# Patient Record
Sex: Male | Born: 1960 | Race: White | Hispanic: No | Marital: Married | Smoking: Never smoker
Health system: Southern US, Community
[De-identification: ages and names within clinical notes are randomized; demographics above are authoritative.]

## PROBLEM LIST (undated history)

## (undated) DIAGNOSIS — I499 Cardiac arrhythmia, unspecified: Secondary | ICD-10-CM

## (undated) DIAGNOSIS — F32A Depression, unspecified: Secondary | ICD-10-CM

## (undated) DIAGNOSIS — F329 Major depressive disorder, single episode, unspecified: Secondary | ICD-10-CM

## (undated) DIAGNOSIS — E785 Hyperlipidemia, unspecified: Secondary | ICD-10-CM

## (undated) DIAGNOSIS — F419 Anxiety disorder, unspecified: Secondary | ICD-10-CM

## (undated) HISTORY — PX: JOINT REPLACEMENT: SHX530

---

## 2013-10-23 HISTORY — PX: HIP SURGERY: SHX245

## 2013-11-10 ENCOUNTER — Encounter (HOSPITAL_COMMUNITY): Payer: Self-pay | Admitting: Emergency Medicine

## 2013-11-10 ENCOUNTER — Emergency Department (HOSPITAL_COMMUNITY)
Admission: EM | Admit: 2013-11-10 | Discharge: 2013-11-10 | Disposition: A | Discharge status: Other Acute Inpatient Hospital | Payer: Medicare (Managed Care) | Attending: Psychiatry | Admitting: Psychiatry

## 2013-11-10 ENCOUNTER — Inpatient Hospital Stay (HOSPITAL_COMMUNITY)
Admission: AD | Admit: 2013-11-10 | Discharge: 2013-11-18 | DRG: 882 | Disposition: A | Discharge status: Home / Self Care | Payer: Medicare (Managed Care) | Source: Intra-hospital | Attending: Psychiatry | Admitting: Psychiatry

## 2013-11-10 ENCOUNTER — Encounter (HOSPITAL_COMMUNITY): Payer: Self-pay | Admitting: *Deleted

## 2013-11-10 ENCOUNTER — Emergency Department (HOSPITAL_COMMUNITY): Payer: Medicare (Managed Care)

## 2013-11-10 DIAGNOSIS — F323 Major depressive disorder, single episode, severe with psychotic features: Secondary | ICD-10-CM

## 2013-11-10 DIAGNOSIS — F22 Delusional disorders: Secondary | ICD-10-CM

## 2013-11-10 DIAGNOSIS — I10 Essential (primary) hypertension: Secondary | ICD-10-CM

## 2013-11-10 DIAGNOSIS — E785 Hyperlipidemia, unspecified: Secondary | ICD-10-CM

## 2013-11-10 DIAGNOSIS — F309 Manic episode, unspecified: Secondary | ICD-10-CM | POA: Insufficient documentation

## 2013-11-10 DIAGNOSIS — R739 Hyperglycemia, unspecified: Secondary | ICD-10-CM

## 2013-11-10 DIAGNOSIS — F4323 Adjustment disorder with mixed anxiety and depressed mood: Principal | ICD-10-CM

## 2013-11-10 DIAGNOSIS — F315 Bipolar disorder, current episode depressed, severe, with psychotic features: Secondary | ICD-10-CM

## 2013-11-10 DIAGNOSIS — Z79899 Other long term (current) drug therapy: Secondary | ICD-10-CM

## 2013-11-10 DIAGNOSIS — R4182 Altered mental status, unspecified: Secondary | ICD-10-CM | POA: Insufficient documentation

## 2013-11-10 HISTORY — DX: Major depressive disorder, single episode, unspecified: F32.9

## 2013-11-10 HISTORY — DX: Hyperlipidemia, unspecified: E78.5

## 2013-11-10 HISTORY — DX: Cardiac arrhythmia, unspecified: I49.9

## 2013-11-10 HISTORY — DX: Anxiety disorder, unspecified: F41.9

## 2013-11-10 HISTORY — DX: Depression, unspecified: F32.A

## 2013-11-10 LAB — COMPREHENSIVE METABOLIC PANEL
Albumin: 4.2 g/dL (ref 3.5–5.2)
BUN: 9 mg/dL (ref 6–23)
CO2: 26 mEq/L (ref 19–32)
Calcium: 9.8 mg/dL (ref 8.4–10.5)
Chloride: 96 mEq/L (ref 96–112)
Creatinine, Ser: 0.78 mg/dL (ref 0.50–1.35)
GFR calc Af Amer: >90 mL/min (ref >90)
GFR calc non Af Amer: >90 mL/min (ref >90)
Glucose, Bld: 141 mg/dL — ABNORMAL HIGH (ref 70–99)
Potassium: 3.4 mEq/L — ABNORMAL LOW (ref 3.5–5.1)
Total Bilirubin: 0.5 mg/dL (ref 0.3–1.2)

## 2013-11-10 LAB — CBC WITH DIFFERENTIAL/PLATELET
Eosinophils Absolute: 0.1 10*3/uL (ref 0.0–0.7)
Lymphocytes Relative: 19 % (ref 12–46)
Lymphs Abs: 1.4 10*3/uL (ref 0.7–4.0)
MCHC: 34.1 g/dL (ref 30.0–36.0)
Neutro Abs: 5.4 10*3/uL (ref 1.7–7.7)
Neutrophils Relative %: 72 % (ref 43–77)
Platelets: 389 10*3/uL (ref 150–400)
RBC: 4.76 MIL/uL (ref 4.22–5.81)
WBC: 7.6 10*3/uL (ref 4.0–10.5)

## 2013-11-10 LAB — RAPID URINE DRUG SCREEN, HOSP PERFORMED
Amphetamines: NOT DETECTED
Barbiturates: NOT DETECTED
Benzodiazepines: NOT DETECTED
Cocaine: NOT DETECTED
Opiates: NOT DETECTED

## 2013-11-10 LAB — ETHANOL: Alcohol, Ethyl (B): <11 mg/dL (ref 0–11)

## 2013-11-10 MED ORDER — TRAZODONE HCL 50 MG PO TABS: 50.0000 mg | ORAL_TABLET | Freq: Every evening | ORAL | Status: DC | PRN

## 2013-11-10 MED ORDER — MAGNESIUM HYDROXIDE 400 MG/5ML PO SUSP: 30.0000 mL | Freq: Every day | ORAL | Status: DC | PRN

## 2013-11-10 MED ORDER — ALUM & MAG HYDROXIDE-SIMETH 200-200-20 MG/5ML PO SUSP: 30.0000 mL | ORAL | Status: DC | PRN

## 2013-11-10 MED ORDER — ACETAMINOPHEN 325 MG PO TABS: 650.0000 mg | ORAL_TABLET | Freq: Four times a day (QID) | ORAL | Status: DC | PRN

## 2013-11-10 NOTE — Tx Team (Signed)
Initial Interdisciplinary Treatment Plan  PATIENT STRENGTHS: (choose at least two) Average or above average intelligence Capable of independent living Communication skills General fund of knowledge Motivation for treatment/growth Religious Affiliation Supportive family/friends  PATIENT STRESSORS: Health problems Medication change or noncompliance   PROBLEM LIST: Problem List/Patient Goals Date to be addressed Date deferred Reason deferred Estimated date of resolution  Recent surgery to L hip      Recent change in mental status-  No prior psych hx.        Confusion-feels it is related to the pain medication he is taking                                           DISCHARGE CRITERIA:  Ability to meet basic life and health needs Improved stabilization in mood, thinking, and/or behavior Medical problems require only outpatient monitoring Motivation to continue treatment in a less acute level of care Verbal commitment to aftercare and medication compliance  PRELIMINARY DISCHARGE PLAN: Outpatient therapy Return to previous living arrangement  PATIENT/FAMIILY INVOLVEMENT: This treatment plan has been presented to and reviewed with the patient, Francisco Hill, and/or family member.  The patient and family have been given the opportunity to ask questions and make suggestions.  Jesus Genera [facility] 11/10/2013, 11:49 PM

## 2013-11-10 NOTE — BH Assessment (Signed)
[facility] Assessment Progress Note  At 17:08 I spoke to EDP Dr Adriana Simas in anticipation of TTS assessment scheduled for 17:30.  Doylene Canning, MA Triage Specialist 11/10/2013 @ 18:57

## 2013-11-10 NOTE — ED Notes (Addendum)
recent hip surgery on 12/5, per family member pt with paranoid behavior and pt with family hx of bipolar, has been taking Indomethacin

## 2013-11-10 NOTE — Progress Notes (Signed)
Vol admit to the 400 hall after presenting to the ED by family member with altered mental status.  Pt has no previous psychiatric history, although he has two grown sons who have a dx of schizophrenia.  Pt is in [place] with his wife and two young children visiting her family for Christmas.  Pt recently had surgery to his L hip which is healing and has been taking oxycodone for pain.  Per wife, pt has begun acting bizarre with abnormal thinking/behaviors.  His speech is repetitive and pressured.  Pt states he has not had any of the pain meds in a few days, but feels this medicine is what is causing his mental changes.  Pt was anxious and nervous during the admission process.  He kept repeating things, especially his code number as if he was afraid he would forget it.  Pt was reminded that he had it written on a paper in his folder and it was also on his wrist band.  Pt was hypervigilant about having copies of all the paperwork and understanding what was taking place during the admission.  Pt was adamant that he did not want any medications tonight and said he was not in any pain.  He denied any major medical issues other than the recent surgery on 12/5.  Pt denies any substance abuse.  Admission was completed and paperwork signed.  Pt was oriented to unit and room.  Pt was encouraged to make his needs known to staff.  Pt given a snack/beverage.  Safety checks initiated q15 minutes.

## 2013-11-10 NOTE — ED Provider Notes (Signed)
CSN: [id number]     Arrival date & time 11/10/13  1509 History   First MD Initiated Contact with Patient 11/10/13 1603     Chief Complaint  Patient presents with  . Altered Mental Status   (Consider location/radiation/quality/duration/timing/severity/associated sxs/prior Treatment) HPI.... level V caveat for altered mental status. Patient has no previous psychiatric history. On December 5, he had surgery on his left hip. Oxycodone was prescribed for pain. He has not taken oxycodone since past Sunday. His wife reports abnormal thinking including pressured speech, hyperreligiosity, referral to guns and knives.  Is not homicidal or suicidal.  Apparently 2 sons have psychiatric problems. No history of alcohol abuse. Presently not taking opiates.  History reviewed. No pertinent past medical history. Past Surgical History  Procedure Laterality Date  . Hip surgery     History reviewed. No pertinent family history. History  Substance Use Topics  . Smoking status: Never Smoker   . Smokeless tobacco: Never Used  . Alcohol Use: No    Review of Systems  Unable to perform ROS   Allergies  Review of patient's allergies indicates no known allergies.  Home Medications   Current Outpatient Rx  Name  Route  Sig  Dispense  Refill  . aspirin 81 MG tablet   Oral   Take 324 mg by mouth 2 (two) times daily.         . Multiple Vitamin (MULTIVITAMIN WITH MINERALS) TABS tablet   Oral   Take 1 tablet by mouth daily.         . Omega-3 Fatty Acids (FISH OIL PO)   Oral   Take 1 capsule by mouth daily.         Marland Kitchen oxycodone (OXY-IR) 5 MG capsule   Oral   Take 5 mg by mouth every 4 (four) hours as needed for pain.          BP 139/71  Pulse 98  Temp(Src) 98 F (36.7 C) (Oral)  Resp 20  Ht 5\' 8"  (1.727 m)  Wt 155 lb (70.308 kg)  BMI 23.57 kg/m2  SpO2 100% Physical Exam  Nursing note and vitals reviewed. Constitutional: He is oriented to person, place, and time.  Talks with eyes  closed  HENT:  Head: Normocephalic and atraumatic.  Eyes: Conjunctivae and EOM are normal. Pupils are equal, round, and reactive to light.  Neck: Normal range of motion. Neck supple.  Cardiovascular: Normal rate, regular rhythm and normal heart sounds.   Pulmonary/Chest: Effort normal and breath sounds normal.  Abdominal: Soft. Bowel sounds are normal.  Musculoskeletal: Normal range of motion.  Neurological: He is alert and oriented to person, place, and time.  Skin: Skin is warm and dry.  Psychiatric: He has a normal mood and affect. His behavior is normal.    ED Course  Procedures (including critical care time) Labs Review Labs Reviewed  COMPREHENSIVE METABOLIC PANEL - Abnormal; Notable for the following:    Potassium 3.4 (*)    Glucose, Bld 141 (*)    All other components within normal limits  CBC WITH DIFFERENTIAL  ETHANOL  URINE RAPID DRUG SCREEN (HOSP PERFORMED)   Imaging Review Ct Head Wo Contrast  11/10/2013   CLINICAL DATA:  Altered mental status  EXAM: CT HEAD WITHOUT CONTRAST  TECHNIQUE: Contiguous axial images were obtained from the base of the skull through the vertex without contrast.  COMPARISON:  None  FINDINGS: Normal appearance of the intracranial structures. No evidence for acute hemorrhage, mass lesion, midline shift, hydrocephalus  or large infarct. No acute bony abnormality. The visualized sinuses are clear.  IMPRESSION: No acute intracranial abnormality.   Electronically Signed   By: Ruel Favors M.D.   On: 11/10/2013 17:24    EKG Interpretation   None       MDM   1. Mania    Behavioral health consultation. Admit to behavioral health    Donnetta Hutching, MD 11/10/13 [phone]

## 2013-11-10 NOTE — BH Assessment (Signed)
Tele Assessment Note   Francisco Hill is a 52 y.o. male.  He presents accompanied by his wife, Namon Villarin, and his father-in-law, [name] [phone]) after the father-in-law called 911.  His preference is for the family members to remain in the room during assessment, and so they stayed, providing collateral information.  The pt and his spouse live in [place], [place], and are in [place] visiting the spouse's family for the holidays.  Pt report that he is here today because "I was up this morning praying to the Lord."  The family members report recent  impulsivity, pressured speech, labile mood and other bizarre behavior.  They have never seen pt behaving this way in the past.  Stressors: Pt reports that he has been "overloaded with circumstances" recently.  He reports that his adult daughter will soon be flying to [place] to visit her boyfriend who is currently deployed there with the [employer].  He is very fearful for her safety.  Pt also reports that his adult son has been diagnosed with schizophrenia, and the pt just found out that the son has been admitted to a psychiatric facility.  Pt reports some conflict with his wife of the past 10 years, stating that she is "pretty self absorbed, worries about herself."  He expresses some remorse about divorcing his first wife, who is the mother of the two adult children.  Pt also reports mild financial stress after having purchased a house as an investment that been more burdensome than expected.  Finally, pt underwent a hip replacement surgery on 10/22/2013.  The surgery went well, but the pt was on a couple medications for pain for a short time that he disliked.  Lethality: Suicidality: Pt reports that during the time of the surgery he had some fleeting, passive SI, thinking that perhaps he would be better off dead.   However, pt denies SI currently, and denies any history of active SI, plan, or intent.  He denies any history of suicide  attempts, or of self mutilation.  Pt endorses recent depressed mood with symptoms noted in the "risk to self" assessment below. Homicidality: Pt denies homicidal thoughts or physical aggression.  Pt keeps firearms in his home, which he has used for hunting in the past.  They are currently unsecured, but with small children in the home, he now thinks that it would be prudent to lock them up.  Pt denies having any legal problems at this time.  Pt is cooperative during assessment, but somewhat anxious, rocking back and forth. Psychosis: Pt reports praying this morning. The spouse reports that pt is a religious man, but indicates that he is more religiously preoccupied than usual.  The pt equivocates when asked about hallucinations.  He denies hearing a voice, but reports that he believes that God was talking to him.  He adds, "the devil's trying to kill members of my family," with specific concern about the wellbeing of his adult children.  He does not appear to be responding to internal stimuli during assessment, but his reality testing appears to be impaired.  The family reports that pt is calmer now than he has been all day, and pt corroborates this.  Earlier they report pt exhibited pressured speech, racing thoughts and impulsive behavior.  He acknowledges not needing more than a couple hours sleep for the past few days.  He also acknowledges increased goal directed activity that he has not been completing.  During assessment, pt expresses a need to  urinate.  This Clinical research associate believed that it was the spouse that needed to urinate, and so did not interrupt the assessment.  Pt went ahead and used a portable urinal during assessment with family members present as well. Substance Abuse: Pt denies any current or past substance abuse problems.  Pt does not appear to be intoxicated or in withdrawal at this time.  Around the time of the hip surgery pt was prescribed Oxy-IR, as well as Indomethacin.  He disliked both of them,  using as little as he could for pain management.  Social Supports: Pt identifies both the spouse and the father-in-law as people he would trust with his life.  However, he late makes the comments noted above about his wife.  He also identifies several friends in [place] as supports, as well as a brother.  He lives with his current wife and their two children, an 13 y/o son and a 30 y/o daughter.  He feels a close bond with both of them, as well as his two adult children, but feels remorse about not being as available for the adult children as he believes he should be.  Their current circumstances exacerbate these feelings.  As noted above, he verbalizes remorse about divorcing his first wife, which he believes was inconsistent with his religious beliefs.  Pt has a masters degree in Catering manager, and he is successfully self employed.  Treatment History: Pt has never received psychiatric treatment, either on an inpatient or an outpatient basis.  Today he trusts the judgment of both the family members present, as well as the EDP.  He is willing to volunteer for admission to a psychiatric facility if it is believed to be in his interest.   Axis I: Bipolar I Disorder, most recent episode mixed, severe, with psychotic features 296.64 Axis II: Deferred 799.9 Axis III: History reviewed. No pertinent past medical history. Axis IV: problems with primary support group Axis V: GAF = 35  Past Medical History: History reviewed. No pertinent past medical history.  Past Surgical History  Procedure Laterality Date  . Hip surgery      Family History: History reviewed. No pertinent family history.  Social History:  reports that he has never smoked. He has never used smokeless tobacco. He reports that he does not drink alcohol or use illicit drugs.  Additional Social History:  Alcohol / Drug Use Pain Medications: Recently prescribed Oxy-IR, but used minimallly Prescriptions: Denies Over the  Counter: Denies History of alcohol / drug use?: No history of alcohol / drug abuse  CIWA: CIWA-Ar BP: 139/71 mmHg Pulse Rate: 98 COWS:    Allergies: No Known Allergies  Home Medications:  (Not in a hospital admission)  OB/GYN Status:  No LMP for male patient.  General Assessment Data Location of Assessment: [facility] Is this a Tele or Face-to-Face Assessment?: Tele Assessment Is this an Initial Assessment or a Re-assessment for this encounter?: Initial Assessment Living Arrangements: Spouse/significant other;Children (Spouse, 46 y/o son, 18 y/o daughter) Can pt return to current living arrangement?: Yes Admission Status: Voluntary Is patient capable of signing voluntary admission?: Yes Transfer from: Acute Hospital Referral Source: Other ([facility])  Medical Screening Exam [facility] Walk-in ONLY) Medical Exam completed: No Reason for MSE not completed: Other: (Medically cleared @ [facility])  [facility] Crisis Care Plan Living Arrangements: Spouse/significant other;Children (Spouse, 72 y/o son, 84 y/o daughter) Name of Psychiatrist: None Name of Therapist: None  Education Status Is patient currently in school?: No Highest grade of school patient has  completed: Master's degree  Risk to self Suicidal Ideation: No Suicidal Intent: No Is patient at risk for suicide?: No Suicidal Plan?: No Access to Means: No What has been your use of drugs/alcohol within the last 12 months?: Denies; recently prescribed Oxy-IR, used below prescribed level Previous Attempts/Gestures: No How many times?: 0 Other Self Harm Risks: Recent fleeting passive SI, thinking he would be better off dead Triggers for Past Attempts: Other (Comment) (Not applicable) Intentional Self Injurious Behavior: None Family Suicide History: No (Adult son - schizophrenia - currently hospitalized) Recent stressful life event(s): Other (Comment) (Son's psych hospitalization; daughter going to [place]) Persecutory voices/beliefs?: Yes ("The  devil's trying to kill members of my family.") Depression: Yes Depression Symptoms: Tearfulness;Guilt;Feeling worthless/self pity;Feeling angry/irritable (Hopelessness) Substance abuse history and/or treatment for substance abuse?: No (Recently prescribed Oxy-IR, used below prescribed level) Suicide prevention information given to non-admitted patients: Not applicable (Tele-assessment: unable to provide)  Risk to Others Homicidal Ideation: No Thoughts of Harm to Others: No Current Homicidal Intent: No Current Homicidal Plan: No Access to Homicidal Means: No Identified Victim: None History of harm to others?: No Assessment of Violence: None Noted Violent Behavior Description: Cooperative, somewhat restless, rocking Does patient have access to weapons?: Yes (Comment) (Endorses having firearms, but wants them locked up.) Criminal Charges Pending?: No Does patient have a court date: No  Psychosis Hallucinations: None noted Delusions: Unspecified;Persecutory (Hyperreligious;"devil's trying to kill members of my family")  Mental Status Report Appear/Hygiene: Other (Comment) [facility] gown) Eye Contact: Good Motor Activity: Restlessness (Rocking) Speech: Pressured;Other (Comment) (Currently unremarkable; per family recent pressured speech) Level of Consciousness: Alert Mood: Anxious Affect: Appropriate to circumstance;Labile (Family reports recent labile mood) Anxiety Level: Moderate Thought Processes: Coherent;Relevant Judgement: Impaired Orientation: Person;Place;Time;Situation (Time: oriented except for hour) Obsessive Compulsive Thoughts/Behaviors: None  Cognitive Functioning Concentration: Decreased Memory: Recent Intact;Remote Intact IQ: Above Average Insight: Fair Impulse Control: Fair Appetite: Poor Weight Loss: 6 (...over past week) Weight Gain: 0 Sleep: Decreased Total Hours of Sleep: 1 (1 - 2 for past 3 days; pt reports feeling rested) Vegetative Symptoms:  Decreased grooming (Mild)  ADLScreening [facility] Assessment Services) Patient's cognitive ability adequate to safely complete daily activities?: Yes Patient able to express need for assistance with ADLs?: Yes Independently performs ADLs?: Yes (appropriate for developmental age)  Prior Inpatient Therapy Prior Inpatient Therapy: No  Prior Outpatient Therapy Prior Outpatient Therapy: No  ADL Screening (condition at time of admission) Patient's cognitive ability adequate to safely complete daily activities?: Yes Is the patient deaf or have difficulty hearing?: No Does the patient have difficulty seeing, even when wearing glasses/contacts?: No Does the patient have difficulty concentrating, remembering, or making decisions?: No Patient able to express need for assistance with ADLs?: Yes Does the patient have difficulty dressing or bathing?: No Independently performs ADLs?: Yes (appropriate for developmental age) Does the patient have difficulty walking or climbing stairs?: No Weakness of Legs: None Weakness of Arms/Hands: None  Home Assistive Devices/Equipment Home Assistive Devices/Equipment: Cane (specify quad or straight)    Abuse/Neglect Assessment (Assessment to be complete while patient is alone) Physical Abuse: Denies Verbal Abuse: Denies Sexual Abuse: Denies Self-Neglect: Denies     Merchant navy officer (For Healthcare) Advance Directive: Patient does not have advance directive (Tele-assessment: unable to provide) Pre-existing out of facility DNR order (yellow form or pink MOST form): No Nutrition Screen- [facility] Adult/[facility] Patient's home diet: Regular  Additional Information 1:1 In Past 12 Months?: No CIRT Risk: No Elopement Risk: No Does patient have medical clearance?: Yes  Disposition:  Disposition Initial Assessment Completed for this Encounter: Yes Disposition of Patient: Inpatient treatment program Type of inpatient treatment program: Adult After consulting  with Kathryne Sharper, MD @ 18:55 it has been determined that due to his impaired reality testing pt presents a danger to himself and others, for which psychiatric hospitalization is indicated.  Pt accepted to [facility] to the service of Thedore Mins, MD, Rm [id number].  At 19:03 I spoke to EDP Dr Adriana Simas who concurs with this opinion.  At 19:05 I spoke to pt's nurse, Arline Asp, to notify her.  She agrees to have pt sign Voluntary Admission and Consent for Treatment, to fax it to [facility], and to send the original with the pt.  She accepted direct phone number to the Adult Unit [phone]) to call nurse-to-nurse report.  Doylene Canning, MA Triage Specialist Raphael Gibney 11/10/2013 7:39 PM

## 2013-11-10 NOTE — ED Notes (Signed)
Transfer to Behavioral Health explained to pt and family. Consents completed. Pt and family have been wanded by security. [facility] has been notified, ETA approx 35 min.

## 2013-11-10 NOTE — ED Notes (Signed)
VS completed. Pt getting dressed to go to [employer] with [employer]. Voluntary status.

## 2013-11-11 ENCOUNTER — Encounter (HOSPITAL_COMMUNITY): Payer: Self-pay | Admitting: Psychiatry

## 2013-11-11 DIAGNOSIS — F4323 Adjustment disorder with mixed anxiety and depressed mood: Principal | ICD-10-CM | POA: Diagnosis present

## 2013-11-11 MED ORDER — TRAZODONE HCL 150 MG PO TABS: 75.0000 mg | ORAL_TABLET | Freq: Every evening | ORAL | Status: DC | PRN

## 2013-11-11 MED ORDER — HYDROXYZINE HCL 25 MG PO TABS
25.0000 mg | ORAL_TABLET | Freq: Four times a day (QID) | ORAL | Status: DC | PRN
  Administered 2013-11-11: 25 mg via ORAL
  Filled 2013-11-11: qty 1

## 2013-11-11 MED ORDER — SERTRALINE HCL 25 MG PO TABS
25.0000 mg | ORAL_TABLET | Freq: Every day | ORAL | Status: DC
  Administered 2013-11-12 – 2013-11-14 (×2): 25 mg via ORAL
  Filled 2013-11-11 (×6): qty 1

## 2013-11-11 MED ORDER — IBUPROFEN 200 MG PO TABS: 600.0000 mg | ORAL_TABLET | Freq: Four times a day (QID) | ORAL | Status: DC | PRN

## 2013-11-11 NOTE — Progress Notes (Signed)
D:  Patient's self inventory sheet, patient has fair sleep, good appetite, normal energy level, improving attention span.  Rated depression and hopeless #5.  Denied withdrawals.  Denied SI.  Denied pain.  After discharge, patient plans to "work at the simple things.  Enjoy time with family."  No questions for staff.  No problems with medications after discharge. A:  No medications administered this morning.  Emotional support and encouragement given patient. R:  Denied SI and Hi.  Contracts for safety.  Denied A/V hallucinations.  Denied pain this morning.  Will continue to monitor patient for safety with 15 minute checks.  Safety maintained. Patient stated he is very concerned about his daughter who is traveling to [place] to see her boyfriend who is in the [employer].  Has not been sleeping well.  Has never had psychiatric care in the past.  Left hip replacement 10/22/2013 and never taken oxycodone for pain previously.  Discussed medications with MD this morning.
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
## 2013-11-11 NOTE — [facility] Suicide Risk Assessment (Signed)
Suicide Risk Assessment  Admission Assessment     Nursing information obtained from:  Patient;Review of record Demographic factors:  Male;Caucasian Current Mental Status:  NA Loss Factors:  NA Historical Factors:  NA Risk Reduction Factors:  Responsible for children under 54 years of age;Sense of responsibility to family;Religious beliefs about death;Living with another person, especially a relative;Positive social support  CLINICAL FACTORS:   Severe Anxiety and/or Agitation Depression:   Anhedonia Hopelessness Impulsivity Insomnia  COGNITIVE FEATURES THAT CONTRIBUTE TO RISK:  Closed-mindedness Polarized thinking    SUICIDE RISK:   Minimal: No identifiable suicidal ideation.  Patients presenting with no risk factors but with morbid ruminations; may be classified as minimal risk based on the severity of the depressive symptoms  PLAN OF CARE:1. Admit for crisis management and stabilization. 2. Medication management to reduce current symptoms to base line and improve the     patient's overall level of functioning 3. Treat health problems as indicated. 4. Develop treatment plan to decrease risk of relapse upon discharge and the need for     readmission. 5. Psycho-social education regarding relapse prevention and self care. 6. Health care follow up as needed for medical problems. 7. Restart home medications where appropriate.  I certify that inpatient services furnished can reasonably be expected to improve the patient's condition.  Thedore Mins, MD 11/11/2013, 12:02 PM

## 2013-11-12 DIAGNOSIS — F411 Generalized anxiety disorder: Secondary | ICD-10-CM

## 2013-11-12 DIAGNOSIS — F332 Major depressive disorder, recurrent severe without psychotic features: Secondary | ICD-10-CM

## 2013-11-12 MED ORDER — ADULT MULTIVITAMIN W/MINERALS CH
1.0000 | ORAL_TABLET | Freq: Every day | ORAL | Status: DC
  Administered 2013-11-12 – 2013-11-18 (×7): 1 via ORAL
  Filled 2013-11-12 (×9): qty 1

## 2013-11-12 MED ORDER — HALOPERIDOL LACTATE 5 MG/ML IJ SOLN
INTRAMUSCULAR | Status: AC
  Administered 2013-11-12: 5 mg via INTRAMUSCULAR
  Filled 2013-11-12: qty 1

## 2013-11-12 MED ORDER — HYDROXYZINE HCL 25 MG PO TABS
25.0000 mg | ORAL_TABLET | Freq: Three times a day (TID) | ORAL | Status: DC
  Administered 2013-11-12 – 2013-11-18 (×11): 25 mg via ORAL
  Filled 2013-11-12 (×5): qty 1
  Filled 2013-11-12 (×2): qty 9
  Filled 2013-11-12 (×5): qty 1
  Filled 2013-11-12: qty 9
  Filled 2013-11-12 (×10): qty 1

## 2013-11-12 MED ORDER — LORAZEPAM 2 MG/ML IJ SOLN
INTRAMUSCULAR | Status: AC
  Administered 2013-11-12: 2 mg via INTRAMUSCULAR
  Filled 2013-11-12: qty 1

## 2013-11-12 MED ORDER — LORAZEPAM 2 MG/ML IJ SOLN: 2.0000 mg | Freq: Once | INTRAMUSCULAR | Status: DC

## 2013-11-12 MED ORDER — HALOPERIDOL LACTATE 5 MG/ML IJ SOLN
5.0000 mg | Freq: Once | INTRAMUSCULAR | Status: DC
  Administered 2013-11-12: 5 mg via INTRAMUSCULAR
  Filled 2013-11-12: qty 1

## 2013-11-12 MED ORDER — TRAZODONE HCL 100 MG PO TABS
100.0000 mg | ORAL_TABLET | Freq: Every evening | ORAL | Status: DC | PRN
  Filled 2013-11-12: qty 1

## 2013-11-12 MED ORDER — LORAZEPAM 2 MG/ML IJ SOLN
2.0000 mg | Freq: Once | INTRAMUSCULAR | Status: DC
  Administered 2013-11-12: 2 mg via INTRAMUSCULAR

## 2013-11-12 MED ORDER — HALOPERIDOL LACTATE 5 MG/ML IJ SOLN
2.0000 mg | Freq: Once | INTRAMUSCULAR | Status: DC
  Filled 2013-11-12: qty 0.4

## 2013-11-12 MED ORDER — ENSURE COMPLETE PO LIQD
237.0000 mL | Freq: Two times a day (BID) | ORAL | Status: DC
  Administered 2013-11-13 – 2013-11-18 (×12): 237 mL via ORAL

## 2013-11-12 NOTE — Progress Notes (Signed)
[facility] MD Progress Note  11/12/2013 12:03 PM Francisco Hill  MRN:  [id number] Subjective:  Patient was lying in bed during assessment prior to lunch.  He stated he did not sleep well--Trazodone increase--anxiety was high last night--Vistaril 25 mg scheduled TID.  Appetite is fair to poor.  Depression remains but "I feel a whole lot better.  I thought I was going to die last night."  He was thinking about his past and has many regrets, ruminated last night and the emotional pain was hard.  He is also experiencing some hallucinations but vague about exactly what they are...he responded after a long pause with "not exactly", monitoring continues. Diagnosis:   DSM5:  Depressive Disorders:  Major Depressive Disorder - with Psychotic Features (296.24)  Axis I: Anxiety Disorder NOS and Major Depression, Recurrent severe Axis II: Deferred Axis III:  Past Medical History  Diagnosis Date  . Anxiety   . Depression    Axis IV: other psychosocial or environmental problems, problems related to social environment and problems with primary support group Axis V: 41-50 serious symptoms  ADL's:  Intact  Sleep: Poor  Appetite:  Poor  Suicidal Ideation:  Denies  Homicidal Ideation:  Denies   Psychiatric Specialty Exam: Review of Systems  Constitutional: Negative.   HENT: Negative.   Eyes: Negative.   Respiratory: Negative.   Cardiovascular: Negative.   Gastrointestinal: Negative.   Genitourinary: Negative.   Musculoskeletal: Negative.   Skin: Negative.   Neurological: Negative.   Endo/Heme/Allergies: Negative.   Psychiatric/Behavioral: Positive for depression and hallucinations. The patient is nervous/anxious and has insomnia.     Blood pressure 112/70, pulse 90, temperature 97.7 F (36.5 C), temperature source Oral, resp. rate 17, height 5\' 8"  (1.727 m), weight 72.122 kg (159 lb).Body mass index is 24.18 kg/(m^2).  General Appearance: Disheveled  Eye Contact::  Poor  Speech:  Slow   Volume:  Decreased  Mood:  Depressed  Affect:  Congruent  Thought Process:  Coherent  Orientation:  Full (Time, Place, and Person)  Thought Content:  Hallucinations: Auditory  Suicidal Thoughts:  No  Homicidal Thoughts:  No  Memory:  Immediate;   Fair Recent;   Fair Remote;   Fair  Judgement:  Poor  Insight:  Lacking  Psychomotor Activity:  Decreased  Concentration:  Fair  Recall:  Fair  Akathisia:  No  Handed:  Right  AIMS (if indicated):     Assets:  Leisure Time Resilience  Sleep:  Number of Hours: 3   Current Medications: Current Facility-Administered Medications  Medication Dose Route Frequency Provider Last Rate Last Dose  . alum & mag hydroxide-simeth (MAALOX/MYLANTA) 200-200-20 MG/5ML suspension 30 mL  30 mL Oral Q4H PRN Cleotis Nipper, MD      . haloperidol lactate (HALDOL) injection 2 mg  2 mg Intramuscular Once Kristeen Mans, NP      . hydrOXYzine (ATARAX/VISTARIL) tablet 25 mg  25 mg Oral TID Nanine Means, NP      . ibuprofen (ADVIL,MOTRIN) tablet 600 mg  600 mg Oral Q6H PRN Mojeed Akintayo      . LORazepam (ATIVAN) injection 2 mg  2 mg Intramuscular Once Kristeen Mans, NP      . magnesium hydroxide (MILK OF MAGNESIA) suspension 30 mL  30 mL Oral Daily PRN Cleotis Nipper, MD      . sertraline (ZOLOFT) tablet 25 mg  25 mg Oral Daily Nanine Means, NP   25 mg at 11/12/13 1124  . traZODone (DESYREL) tablet 100  mg  100 mg Oral QHS PRN Nanine Means, NP        Lab Results:  Results for orders placed during the hospital encounter of 11/10/13 (from the past 48 hour(s))  URINE RAPID DRUG SCREEN (HOSP PERFORMED)     Status: None   Collection Time    11/10/13  4:52 PM      Result Value Range   Opiates NONE DETECTED  NONE DETECTED   Cocaine NONE DETECTED  NONE DETECTED   Benzodiazepines NONE DETECTED  NONE DETECTED   Amphetamines NONE DETECTED  NONE DETECTED   Tetrahydrocannabinol NONE DETECTED  NONE DETECTED   Barbiturates NONE DETECTED  NONE DETECTED   Comment:             DRUG SCREEN FOR MEDICAL PURPOSES     ONLY.  IF CONFIRMATION IS NEEDED     FOR ANY PURPOSE, NOTIFY LAB     WITHIN 5 DAYS.                LOWEST DETECTABLE LIMITS     FOR URINE DRUG SCREEN     Drug Class       Cutoff (ng/mL)     Amphetamine      1000     Barbiturate      200     Benzodiazepine   200     Tricyclics       300     Opiates          300     Cocaine          300     THC              50  COMPREHENSIVE METABOLIC PANEL     Status: Abnormal   Collection Time    11/10/13  4:53 PM      Result Value Range   Sodium 135  135 - 145 mEq/L   Potassium 3.4 (*) 3.5 - 5.1 mEq/L   Chloride 96  96 - 112 mEq/L   CO2 26  19 - 32 mEq/L   Glucose, Bld 141 (*) 70 - 99 mg/dL   BUN 9  6 - 23 mg/dL   Creatinine, Ser 6.57  0.50 - 1.35 mg/dL   Calcium 9.8  8.4 - 84.6 mg/dL   Total Protein 7.9  6.0 - 8.3 g/dL   Albumin 4.2  3.5 - 5.2 g/dL   AST 22  0 - 37 U/L   ALT 28  0 - 53 U/L   Alkaline Phosphatase 58  39 - 117 U/L   Total Bilirubin 0.5  0.3 - 1.2 mg/dL   GFR calc non Af Amer >90  >90 mL/min   GFR calc Af Amer >90  >90 mL/min   Comment: (NOTE)     The eGFR has been calculated using the CKD EPI equation.     This calculation has not been validated in all clinical situations.     eGFR's persistently <90 mL/min signify possible Chronic Kidney     Disease.  CBC WITH DIFFERENTIAL     Status: None   Collection Time    11/10/13  4:53 PM      Result Value Range   WBC 7.6  4.0 - 10.5 K/uL   RBC 4.76  4.22 - 5.81 MIL/uL   Hemoglobin 13.9  13.0 - 17.0 g/dL   HCT 96.2  95.2 - 84.1 %   MCV 85.7  78.0 - 100.0 fL   MCH 29.2  26.0 - 34.0 pg   MCHC 34.1  30.0 - 36.0 g/dL   RDW 40.9  81.1 - 91.4 %   Platelets 389  150 - 400 K/uL   Neutrophils Relative % 72  43 - 77 %   Neutro Abs 5.4  1.7 - 7.7 K/uL   Lymphocytes Relative 19  12 - 46 %   Lymphs Abs 1.4  0.7 - 4.0 K/uL   Monocytes Relative 8  3 - 12 %   Monocytes Absolute 0.6  0.1 - 1.0 K/uL   Eosinophils Relative 1  0 - 5 %    Eosinophils Absolute 0.1  0.0 - 0.7 K/uL   Basophils Relative 0  0 - 1 %   Basophils Absolute 0.0  0.0 - 0.1 K/uL  ETHANOL     Status: None   Collection Time    11/10/13  4:53 PM      Result Value Range   Alcohol, Ethyl (B) <11  0 - 11 mg/dL   Comment:            LOWEST DETECTABLE LIMIT FOR     SERUM ALCOHOL IS 11 mg/dL     FOR MEDICAL PURPOSES ONLY    Physical Findings: AIMS: Facial and Oral Movements Muscles of Facial Expression: None, normal Lips and Perioral Area: None, normal Jaw: None, normal Tongue: None, normal,Extremity Movements Upper (arms, wrists, hands, fingers): None, normal Lower (legs, knees, ankles, toes): None, normal, Trunk Movements Neck, shoulders, hips: None, normal, Overall Severity Severity of abnormal movements (highest score from questions above): None, normal Incapacitation due to abnormal movements: None, normal Patient's awareness of abnormal movements (rate only patient's report): No Awareness, Dental Status Current problems with teeth and/or dentures?: No Does patient usually wear dentures?: No  CIWA:  CIWA-Ar Total: 3 COWS:  COWS Total Score: 3  Treatment Plan Summary: Daily contact with patient to assess and evaluate symptoms and progress in treatment Medication management  Plan:  Review of chart, vital signs, medications, and notes. 1-Individual and group therapy 2-Medication management for depression and anxiety:  Medications reviewed with the patient and Trazodone increased for sleep issues, Vistaril scheduled versus PRN 3-Coping skills for depression, anxiety 4-Continue crisis stabilization and management 5-Address health issues--monitoring vital signs, stable 6-Treatment plan in progress to prevent relapse of depression and anxiety  Medical Decision Making Problem Points:  Established problem, stable/improving (1) and Review of psycho-social stressors (1) Data Points:  Review of new medications or change in dosage (2)  I certify  that inpatient services furnished can reasonably be expected to improve the patient's condition.   Nanine Means, PMH-NP 11/12/2013, 12:03 PM

## 2013-11-12 NOTE — Progress Notes (Addendum)
D: Pt remains religiously preoccupied. As reported earlier, this pt was observed crawling on the floor with his cane. Writer asked pt if he was okay. Pt replied that he was just praying to God, because he believe that he is on the edge of dying. Pt becomes loud and repeats that he is just praying.  A: Writer offered continued support and availably as needed to pt. Pt politely accepted my offer. Writer gave pt privacy to continue to pray. Staff will continue to monitor pt with q83min checks.  R: Pt remains calm at this time.

## 2013-11-12 NOTE — Tx Team (Signed)
  Interdisciplinary Treatment Plan Update   Date Reviewed:  11/12/2013  Time Reviewed:  8:38 AM  Progress in Treatment:   Attending groups: Yes Participating in groups: Yes Taking medication as prescribed: Yes  Tolerating medication: Yes Family/Significant other contact made: No Patient understands diagnosis: Yes AEB asking for help with depression and anxiety Discussing patient identified problems/goals with staff: Yes Medical problems stabilized or resolved: Yes Denies suicidal/homicidal ideation: Yes In tx team Patient has not harmed self or others: Yes  For review of initial/current patient goals, please see plan of care.  Estimated Length of Stay:  2-4 days  Reason for Continuation of Hospitalization: Medication stabilization  New Problems/Goals identified:  N/A  Discharge Plan or Barriers:   return home, follow up outpt  Additional Comments:  "I am doing fine to day, I was brought to the emergency room by my wife because I was stressed out.''  History of Present Illness:: Patient is a 52 year old man who was brought th the ED yesterday by his wife, Francisco Hill, and his father-in-law, Francisco Hill [phone]) . Patient denies prior history of mental illness or substance abuse. Patient and his spouse live in [place], [place], and are in [place] visiting the spouse's family for the holidays. Patient reports he has been under a lot of stress in the last few weeks. States that he been full of prayer to the Lord to protect his family."    Attendees:  Signature: Thedore Mins, MD 11/12/2013 8:38 AM   Signature: Richelle Ito, LCSW 11/12/2013 8:38 AM  Signature: Fransisca Kaufmann, NP 11/12/2013 8:38 AM  Signature: Joslyn Devon, RN 11/12/2013 8:38 AM  Signature: Liborio Nixon, RN 11/12/2013 8:38 AM  Signature:  11/12/2013 8:38 AM  Signature:   11/12/2013 8:38 AM  Signature:    Signature:    Signature:    Signature:    Signature:    Signature:      Scribe for  Treatment Team:   Richelle Ito, LCSW  11/12/2013 8:38 AM

## 2013-11-12 NOTE — Progress Notes (Signed)
Pt found in room trying to bust his room window with his cane. Writer alerted additional staff for a show of support. Pt's cane was found broken. Staff initiated verbal de-escalation to prevent pt from harming himself. Writer offered to obtain an order for something to help him sleep. Pt declined.  Gilmer Mor was removed from pt. Pt continued to threat to bust his room window out. Pt then punch the window and began to pace the room. Pt was excessively loud and frequently voiced how he was going to hell. Pt also frightened staff to distance themselves away from him as satan has token over him.  Assigned RN, additional RN, Tech, [employer], and charge nurse present. On-call extender was contacted to observe pt's behavior. An order for 2 mg of Ativan and 5 Mg of Haldol was obtained.  Staff continued to verbally deescalate the pt before administering the shot. Pt voluntarily went into a prone position and allowed staff to support arms and legs to prevent injury with administration.

## 2013-11-12 NOTE — Progress Notes (Signed)
Pt was put on a 1:1   He has a great potential for a fall due to recent surgery left hip replacement   He uses a cane and during the night he broke it into pieces that are not useable  He has an unsteady gait   He received sedating medications and it was felt he is at risk for a fall if not closely supervised

## 2013-11-12 NOTE — Progress Notes (Signed)
1:1 1345 D Pt is seen sitting in group room, eating peanut butter crackers. HE is pleasant, cooperative, yet guarded when this nurse approaches him and begins to conversate with him. HE avoids eye contact. HE is a little disorganized, pauses slightly when he thinks about what ever his answer will be ( ? If he is continuing to have thought blocking?, ).     A He is encouraged to complete self inventory but has yet to do so. He says he can remember little of lst night, saying over and voer and over " I was so sick.Marland Kitchenibuprofen don't know what happened".    R 1: 1 cont per MD order and safety maintained.
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
## 2013-11-12 NOTE — Progress Notes (Signed)
Nrsg 1:1 0920 D Pt observed lying supine in his bed, breathing comfortably and in NAD; sound asleep.    A Pt's 1:1 MHT at his bedside per MD order.    R 1:1 cont and safety is maintained.

## 2013-11-12 NOTE — Progress Notes (Signed)
D:  Patient calm at start of shift, but returned from dinner extremely anxious.  He went to his room and was pacing back and forth.  On approach he started talking about being a failure, feeling worthless, depressed, and anxious.  When offered medications he would not take them stating that he knew if he took them he would die.  Became more and more religiously preoccupied.  He did lie down for a short time, then was heard yelling in his room.  He was observed on his knees praying loudly and asked to take the medicines that had been offered earlier.  He took the medications with one or two sips of water and threw the rest of the water at me and stated "Get out!  I'm sorry, I'm so very sorry." A:  Order received for hydroxyzine 25 mg PO and Zoloft 25 mg to start this evening.  I spent 45 minutes talking with patient and reassuring him that the medicine were safe and that they are not addictive and that they do not cause any kind of "high."  Explained that they are both very safe and that he was not really thinking clearly.  Explained that it was okay for him to believe that God would help him, but reminded him that as human beings, we sometimes need to help and trust other human beings.  I did bring the medications back down to his room per his request and he did take them.  R:  Patient is currently in bed and quiet.  He is very disorganized in his thinking and is religiously preoccupied.  He has been irrational tangential. and has difficulty engaging in a normal conversation.  Safety is maintained on the unit.

## 2013-11-12 NOTE — [facility] Group Notes (Signed)
[facility] LCSW Group Therapy  11/12/2013  1:05 PM  Type of Therapy:  Group therapy  Participation Level:  Active  Participation Quality:  Attentive  Affect:  Flat  Cognitive:  Oriented  Insight:  Limited  Engagement in Therapy:  Limited  Modes of Intervention:  Discussion, Socialization  Summary of Progress/Problems:  Chaplain was here to lead a group on themes of hope and courage. "We all need to be loved to have hope.  My family and church family are very important to me, and make me feel hope."  Fell asleep for awhile.  Apologized, and explained that he did not sleep well last night.  He is hopeful that his family will come and they will have a nice meal together tonight. Daryel Gerald B 11/12/2013 1:35 PM

## 2013-11-12 NOTE — Progress Notes (Signed)
Patient safety maintained on the unit. Q 15 checks continue. One to one observation continues.

## 2013-11-12 NOTE — Progress Notes (Signed)
NUTRITION ASSESSMENT  Pt identified as at risk on the Malnutrition Screen Tool  INTERVENTION: 1. Educated patient on the importance of nutrition and encouraged intake of food and beverages. 2. Discussed weight goals. 3. Supplements: Ensure Complete po BID, each supplement provides 350 kcal and 13 grams of protein MVI daily  NUTRITION DIAGNOSIS: Unintentional weight loss related to sub-optimal intake as evidenced by pt report.   Goal: Pt to meet >/= 90% of their estimated nutrition needs.  Monitor:  PO intake  Assessment:  Patient admitted with adjustment disorder with mixed anxiety and depressed mood.  Fair to poor appetite and states that he is not eating well.  Difficult to understand at times.  UBW 158 lbs.  Receptive to Ensure.    52 y.o. male  Height: Ht Readings from Last 1 Encounters:  11/10/13 5\' 8"  (1.727 m)    Weight: Wt Readings from Last 1 Encounters:  11/10/13 159 lb (72.122 kg)    Weight Hx: Wt Readings from Last 10 Encounters:  11/10/13 159 lb (72.122 kg)  11/10/13 155 lb (70.308 kg)    BMI:  Body mass index is 24.18 kg/(m^2). Pt meets criteria for wnl based on current BMI.  Estimated Nutritional Needs: Kcal: 25-30 kcal/kg Protein: > 1 gram protein/kg Fluid: 1 ml/kcal  Diet Order: General Pt is also offered choice of unit snacks mid-morning and mid-afternoon.  Pt is eating as desired.   Lab results and medications reviewed.   Oran Rein, RD, LDN Clinical Inpatient Dietitian Pager:  [phone] Weekend and after hours pager:  [phone]

## 2013-11-12 NOTE — Progress Notes (Signed)
1:1 D Pt is seen OOB UAL in his room...tolerated fair. HE remains anxious, disorganized in his thinking and speech..he frequently says the same phrase over and over..   A He is not able to answer self inventory questions and shakes his head from side to side...staring straight ahead when he cannot find an answer.   R 1:1 cont and poc maintained.

## 2013-11-12 NOTE — Progress Notes (Signed)
Adult Psychoeducational Group Note  Date:  11/12/2013 Time:  10:11 PM  Group Topic/Focus:  Wrap-Up Group:   The focus of this group is to help patients review their daily goal of treatment and discuss progress on daily workbooks.  Participation Level:  Did Not Attend  Participation Quality:    Affect:    Cognitive:    Insight:   Engagement in Group:    Modes of Intervention:    Additional Comments:  Pt did not attend wrap-up group this evening.   Derrill Bagnell A 11/12/2013, 10:11 PM

## 2013-11-13 MED ORDER — POTASSIUM CHLORIDE CRYS ER 10 MEQ PO TBCR
10.0000 meq | EXTENDED_RELEASE_TABLET | Freq: Every day | ORAL | Status: DC
  Administered 2013-11-13 – 2013-11-18 (×6): 10 meq via ORAL
  Filled 2013-11-13 (×2): qty 1
  Filled 2013-11-13: qty 3
  Filled 2013-11-13 (×5): qty 1

## 2013-11-13 MED ORDER — HALOPERIDOL 1 MG PO TABS
1.0000 mg | ORAL_TABLET | Freq: Every day | ORAL | Status: DC
  Administered 2013-11-13: 1 mg via ORAL
  Filled 2013-11-13 (×2): qty 1

## 2013-11-13 MED ORDER — DIPHENHYDRAMINE HCL 50 MG PO CAPS
50.0000 mg | ORAL_CAPSULE | Freq: Once | ORAL | Status: DC
  Filled 2013-11-13: qty 1
  Filled 2013-11-13: qty 2

## 2013-11-13 MED ORDER — HYDROCHLOROTHIAZIDE 25 MG PO TABS
25.0000 mg | ORAL_TABLET | Freq: Every day | ORAL | Status: DC
  Administered 2013-11-14 – 2013-11-18 (×5): 25 mg via ORAL
  Filled 2013-11-13 (×9): qty 1

## 2013-11-13 MED ORDER — HALOPERIDOL LACTATE 5 MG/ML IJ SOLN
2.0000 mg | Freq: Four times a day (QID) | INTRAMUSCULAR | Status: DC | PRN
  Administered 2013-11-13: 2 mg via INTRAMUSCULAR
  Filled 2013-11-13: qty 1

## 2013-11-13 MED ORDER — HALOPERIDOL 1 MG PO TABS: 2.0000 mg | ORAL_TABLET | Freq: Four times a day (QID) | ORAL | Status: DC | PRN

## 2013-11-13 NOTE — Progress Notes (Signed)
Patient reported increased agitation and anxiety stating "I have a feeling of impending doom". Patient stated that he was feeling dizzy. Patient restless, pacing.   Given encouragement and ordered Haldol 2133. Patient reminded of fall precautions.   Patient continues to feel anxious and restless. Agitation decreased. States that he does not want to mix medications when offered prn Trazodone to promote rest. Patient reports that he will try to rest on his own. Continuing to monitor.  Patient safety maintained, Q 15 checks and one to one continue.

## 2013-11-13 NOTE — Progress Notes (Signed)
Patient safety maintained. Q 15 checks continue. One to one continues.

## 2013-11-13 NOTE — Progress Notes (Signed)
[facility] MD Progress Note  11/13/2013 12:11 PM Francisco Hill  MRN:  [id number] Subjective:  I don't have any problem.  I think I need to be discharged.  I was here because my son admitted to mental institution and I want to see how it feels when I am in the mental institution. Objective : Patient seen chart reviewed.  Patient is very guarded and remains disorganized and paranoid.  He is refusing to Zoloft.  He does not believe he has any depression or any psychiatric illness.  He believe he is here because his son was admitted to mental institution and he wants to experience the same feeling at mental institution.  He is on one to one for fall precaution.  He's refusing his psychotropic medication.  He was given Haldol injection in the past for his agitation .  He is refusing to take Zoloft.  He does not believe he has any psychiatric illness.  He endorse he is sleeping better however he appears very vigilant, paranoid and guarded.  He is seeing religiously preoccupied and continues to talk about his past , regrets that his family situation.  He has thought blocking sometimes.  He denies any hallucinations, suicidal thought but appears preoccupied and hypervigilant.   Diagnosis:   DSM5:  Depressive Disorders:  Major Depressive Disorder - with Psychotic Features (296.24)  Axis I: Major Depression, Recurrent severe Axis II: Deferred Axis III:  Past Medical History  Diagnosis Date  . Anxiety   . Depression    Axis IV: other psychosocial or environmental problems, problems related to social environment and problems with primary support group Axis V: 41-50 serious symptoms  ADL's:  Intact  Sleep: Poor  Appetite:  Poor  Suicidal Ideation:  Denies  Homicidal Ideation:  Denies   Psychiatric Specialty Exam: Review of Systems  Constitutional: Negative.   HENT: Negative.   Eyes: Negative.   Respiratory: Negative.   Cardiovascular: Negative.   Gastrointestinal: Negative.   Genitourinary:  Negative.   Musculoskeletal: Negative.   Skin: Negative.   Neurological: Negative.   Endo/Heme/Allergies: Negative.   Psychiatric/Behavioral: Positive for depression and hallucinations. The patient is nervous/anxious and has insomnia.     Blood pressure 172/93, pulse 87, temperature 98 F (36.7 C), temperature source Oral, resp. rate 16, height 5\' 8"  (1.727 m), weight 159 lb (72.122 kg).Body mass index is 24.18 kg/(m^2).  General Appearance: Disheveled  Eye Contact::  Poor  Speech:  Slow  Volume:  Decreased  Mood:  Depressed  Affect:  Congruent  Thought Process:  Circumstantial, Irrelevant and Loose  Orientation:  Full (Time, Place, and Person)  Thought Content:  Hallucinations: Auditory  Suicidal Thoughts:  No  Homicidal Thoughts:  No  Memory:  Immediate;   Fair Recent;   Fair Remote;   Fair  Judgement:  Poor  Insight:  Lacking  Psychomotor Activity:  Decreased  Concentration:  Fair  Recall:  Fair  Akathisia:  No  Handed:  Right  AIMS (if indicated):     Assets:  Leisure Time Resilience  Sleep:  Number of Hours: 5.25   Current Medications: Current Facility-Administered Medications  Medication Dose Route Frequency Provider Last Rate Last Dose  . alum & mag hydroxide-simeth (MAALOX/MYLANTA) 200-200-20 MG/5ML suspension 30 mL  30 mL Oral Q4H PRN Cleotis Nipper, MD      . feeding supplement (ENSURE COMPLETE) (ENSURE COMPLETE) liquid 237 mL  237 mL Oral BID BM Jeoffrey Massed, RD   237 mL at 11/13/13 0740  .  haloperidol lactate (HALDOL) injection 2 mg  2 mg Intramuscular Once Kristeen Mans, NP      . hydrochlorothiazide (HYDRODIURIL) tablet 25 mg  25 mg Oral Daily Nanine Means, NP      . hydrOXYzine (ATARAX/VISTARIL) tablet 25 mg  25 mg Oral TID Nanine Means, NP   25 mg at 11/12/13 1857  . ibuprofen (ADVIL,MOTRIN) tablet 600 mg  600 mg Oral Q6H PRN Mojeed Akintayo      . LORazepam (ATIVAN) injection 2 mg  2 mg Intramuscular Once Kristeen Mans, NP      . magnesium hydroxide  (MILK OF MAGNESIA) suspension 30 mL  30 mL Oral Daily PRN Cleotis Nipper, MD      . multivitamin with minerals tablet 1 tablet  1 tablet Oral Daily Jeoffrey Massed, RD   1 tablet at 11/13/13 0740  . potassium chloride (K-DUR,KLOR-CON) CR tablet 10 mEq  10 mEq Oral Daily Nanine Means, NP   10 mEq at 11/13/13 1155  . sertraline (ZOLOFT) tablet 25 mg  25 mg Oral Daily Nanine Means, NP   25 mg at 11/12/13 1124  . traZODone (DESYREL) tablet 100 mg  100 mg Oral QHS PRN Nanine Means, NP        Lab Results:  No results found for this or any previous visit (from the past 48 hour(s)).  Physical Findings: AIMS: Facial and Oral Movements Muscles of Facial Expression: None, normal Lips and Perioral Area: None, normal Jaw: None, normal Tongue: None, normal,Extremity Movements Upper (arms, wrists, hands, fingers): None, normal Lower (legs, knees, ankles, toes): None, normal, Trunk Movements Neck, shoulders, hips: None, normal, Overall Severity Severity of abnormal movements (highest score from questions above): None, normal Incapacitation due to abnormal movements: None, normal Patient's awareness of abnormal movements (rate only patient's report): No Awareness, Dental Status Current problems with teeth and/or dentures?: No Does patient usually wear dentures?: No  CIWA:  CIWA-Ar Total: 3 COWS:  COWS Total Score: 3  Treatment Plan Summary: Daily contact with patient to assess and evaluate symptoms and progress in treatment Medication management  Plan:   Patient is refusing his medication .  He refused Zoloft this morning.  Patient did respond well with Haldol injection.  Would consider adding low-dose Haldol at that time to help his paranoia and disorganized thinking .  Continue to encourage for group participation.  Continue crisis stabilization .   Medical Decision Making Problem Points:  Established problem, stable/improving (1) and Review of psycho-social stressors (1) Data Points:  Review of  new medications or change in dosage (2)  I certify that inpatient services furnished can reasonably be expected to improve the patient's condition.   Shaketa Serafin T. 11/13/2013, 12:11 PM

## 2013-11-13 NOTE — Progress Notes (Signed)
Patient ID: Francisco Hill, male   DOB: October 01, 1961, 52 y.o.   MRN: [id number] 11-13-13 @1300  nursing 1:1 note: D: pt ate 100 % of his lunch and had approximately  1000 cc of fluids. He continues to refuse his medications with the exception of his potassium. He was told his b/p was high and he stated he didn't want to take any hypertensive medications. A: staff had to intercede when this pt took a dislike to another pt in the dayroom and stated he wanted him out of the dayroom. Staff reminded this pt that all patients are allowed in the dayroom provided their behavior is appropriate. The patient in question had appropriate behavior.   R: this patient decided he would go to his room and rest. RN will monitor and the 1:1 continues.

## 2013-11-13 NOTE — Progress Notes (Addendum)
Patient ID: Francisco Hill, male   DOB: 1961/04/27, 52 y.o.   MRN: [id number] 11-14-11 @ 0900  Nursing 1:1 note  D:Pt observed  in the milieu, interacting very little. He is refusing his medications. No s/s of distress. A: 1:1 observation continues for safety  R: pt remains safe; RN will continue to monitor and the 1:1 continues. On his inventory sheet he wrote: slept fair, appetite improving, energy low, attention improving with his depression and hopelessness both at 5. Withdrawal n/a. He denies any suicidal thoughts or hurting himself. After discharge he plans to " make lots of changes; love my wife and kids more" .Marland Kitchen

## 2013-11-13 NOTE — [facility] Group Notes (Signed)
[facility] LCSW Group Therapy  11/13/2013 11:00 AM  Type of Therapy:  Group Therapy  Participation Level:  Active  Participation Quality:  Attentive, Monopolizing, Redirectable and Sharing  Affect:  Appropriate  Cognitive:  Alert and Oriented  Insight:  Limited  Engagement in Therapy:  Engaged  Modes of Intervention:  Clarification, Discussion, Exploration, Rapport Building, Socialization and Support  Summary of Progress/Problems: The main focus of today's process group was for the patient to identify ways in which they have in the past sabotaged their own recovery. Motivational Interviewing was utilized to ask the group members what they get out of their substance use, and what reasons they may have for wanting to change. The Stages of Change were explained using a handout, and patients identified where they currently are with regard to stages of change.  Francisco Hill shared that his main form of self sabotage is negative thinking, especially "believing family doesn't care about me, doesn't love me, when in reality they are my best supports." Francisco Hill sees himself in the Action stage of changing his behavior and reports "my faith will help me the most."   Patient made multiple comments about negative nature of self sabotaging behavior to point of trying to redirect facilitator to "talk of things more positive." Pt encouraged to see signs of self sabotage as warning signs and he settled down with continued redirection.   Francisco Hill 11/13/2013

## 2013-11-13 NOTE — Progress Notes (Signed)
Patient ID: Francisco Hill, male   DOB: [birth year], 52 y.o.   MRN: [id number] 11-13-13 @ 1700 nursing 1:1 note: D: pt became anxious/agitation, threatening and verbally aggressive at approximately 1548 to staff and other patients. A: an order was obtained for haldol 2 mg im/po and it was administered to the patient by injection. He was taken to the open quiet room so that he could calm down. He is on a 1:1 and the mht is with him in the quiet room and he the patient was told he could go and come as he pleases in and out of the quiet room.  R: the patient anxiety and aggression were decreased and later in the shift he was taken back to the unit. The 1:1 continues.

## 2013-11-13 NOTE — Progress Notes (Signed)
Nursing 1:1 note D:Pt observed sleeping in bed with eyes closed. RR even and unlabored. No distress noted. A: 1:1 observation continues for safety  R: pt remains safe  

## 2013-11-13 NOTE — Progress Notes (Signed)
Patient ID: Francisco Hill, male   DOB: 01-29-1961, 52 y.o.   MRN: [id number] Psychoeducational Group Note  Date:  11/13/2013 Time:0930am  Group Topic/Focus:  Identifying Needs:   The focus of this group is to help patients identify their personal needs that have been historically problematic and identify healthy behaviors to address their needs.  Participation Level:  Active  Participation Quality:  Inattentive  Affect:  Anxious  Cognitive:  Confused  Insight:  Resistant  Engagement in Group:  Resistant  Additional Comments:  Inventory and Psychoeducational group   Valente David 11/13/2013,11:39 AM

## 2013-11-13 NOTE — Progress Notes (Signed)
The focus of this group is to help patients review their daily goal of treatment and discuss progress on daily workbooks. Pt attended the evening group session and responded to all discussion prompts from the Writer. Pt shared that today was a good day, the highlight of which was a visit from his wife. When asked about his needs for the evening, Pt requested another pillow, which was given to him following group. Pt then expressed gratitude for the staff here. His affect was appropriate throughout group.

## 2013-11-14 MED ORDER — LORAZEPAM 1 MG PO TABS
2.0000 mg | ORAL_TABLET | Freq: Once | ORAL | Status: AC
  Administered 2013-11-14: 2 mg via ORAL
  Filled 2013-11-14: qty 2

## 2013-11-14 MED ORDER — LORAZEPAM 2 MG/ML IJ SOLN: 2.0000 mg | Freq: Once | INTRAMUSCULAR | Status: AC

## 2013-11-14 MED ORDER — LORAZEPAM 2 MG/ML IJ SOLN: 2.0000 mg | Freq: Once | INTRAMUSCULAR | Status: DC

## 2013-11-14 MED ORDER — TRAZODONE HCL 100 MG PO TABS
100.0000 mg | ORAL_TABLET | Freq: Every evening | ORAL | Status: DC | PRN
  Administered 2013-11-14 – 2013-11-17 (×4): 100 mg via ORAL
  Filled 2013-11-14: qty 1
  Filled 2013-11-14: qty 6
  Filled 2013-11-14 (×6): qty 1
  Filled 2013-11-14: qty 6
  Filled 2013-11-14 (×3): qty 1

## 2013-11-14 MED ORDER — BENZTROPINE MESYLATE 0.5 MG PO TABS
0.5000 mg | ORAL_TABLET | Freq: Two times a day (BID) | ORAL | Status: DC
  Administered 2013-11-14 – 2013-11-18 (×6): 0.5 mg via ORAL
  Filled 2013-11-14: qty 1
  Filled 2013-11-14: qty 6
  Filled 2013-11-14 (×2): qty 1
  Filled 2013-11-14: qty 6
  Filled 2013-11-14 (×8): qty 1

## 2013-11-14 MED ORDER — HALOPERIDOL 2 MG PO TABS
2.0000 mg | ORAL_TABLET | Freq: Two times a day (BID) | ORAL | Status: DC
  Administered 2013-11-14 – 2013-11-18 (×6): 2 mg via ORAL
  Filled 2013-11-14 (×4): qty 1
  Filled 2013-11-14: qty 6
  Filled 2013-11-14 (×6): qty 1
  Filled 2013-11-14: qty 6

## 2013-11-14 NOTE — Progress Notes (Signed)
Patient ID: Francisco Hill, male   DOB: [birth year], 52 y.o.   MRN: [id number] 11-14-13 @ 1700 nursing 1:1 note: D: staff continues to work with this patient. He remains a fall risk and seems to be hypomanic at this time. A:  With permission from the extender, since this pt is on a 1:1, RN allowed pt to go to the gym with the mht after he had taken his 1700 medications, haldol 2 mg and vistaril 25 mg. Also the pt asked that his 1:1 be discontinued. R: he did well going to the gym and RN told him only the MD can d/c the 1:1. R: also on his inventory sheet he wrote: slept fair, appetite good, energy normal, attention improving with his depression and hopelessness both at 5. He denied any si/hi/av. After discharge he plans to 'work with my wife and children more". The 1:1 continues.

## 2013-11-14 NOTE — Progress Notes (Signed)
Patient ID: Francisco Hill, male   DOB: [birth year], 52 y.o.   MRN: [id number] [facility] MD Progress Note  11/14/2013 11:35 AM Francisco Hill  MRN:  [id number] Subjective: Patient is a 52 year old male admitted due to psychosis. Patient recently had a hip replacement and by report since then he has been struggling with his sleep, has been psychotic. Patient refused to take medication yesterday, met with patient this morning and discussed the need for him to take medications in order for him to do well. Patient states that he is willing to do so. Also discussed the need for patient to participate in therapeutic milieu.  Met with wife this morning who reports that patient's sister is diagnosed with bipolar disorder for the past 30 years and their son is also diagnosed with bipolar disorder. His wife states that the patient had not slept for 3 nights prior to hospitalization, has been psychotic. She denies him having any history of mental health issues in the past. She also reports that he's not depressed but seems grandiose at times.  DSM5:   Axis I: Anxiety Disorder NOS, Mood Disorder NOS and Psychotic Disorder NOS Axis II: Deferred Axis III:  Past Medical History  Diagnosis Date  . Anxiety   . Depression    Axis IV: other psychosocial or environmental problems, problems related to social environment and problems with primary support group Axis V: 41-50 serious symptoms  ADL's:  Intact  Sleep: Poor  Appetite:  Poor  Suicidal Ideation:  Denies  Homicidal Ideation:  Denies   Psychiatric Specialty Exam: Review of Systems  Constitutional: Negative.   HENT: Negative.   Eyes: Negative.   Respiratory: Negative.   Cardiovascular: Negative.   Gastrointestinal: Negative.   Genitourinary: Negative.   Musculoskeletal: Negative.   Skin: Negative.   Neurological: Negative.   Endo/Heme/Allergies: Negative.   Psychiatric/Behavioral: Positive for hallucinations. Negative for depression. The  patient is nervous/anxious and has insomnia.     Blood pressure 124/83, pulse 120, temperature 97.5 F (36.4 C), temperature source Oral, resp. rate 17, height 5\' 8"  (1.727 m), weight 159 lb (72.122 kg).Body mass index is 24.18 kg/(m^2).  General Appearance: Disheveled  Eye Contact::  Poor  Speech:  Slow  Volume:  Decreased  Mood:  Angry, Anxious and Hopeless  Affect:  Congruent  Thought Process:  Circumstantial, Coherent and Linear  Orientation:  Full (Time, Place, and Person)  Thought Content:  Hallucinations: Auditory  Suicidal Thoughts:  No  Homicidal Thoughts:  No  Memory:  Immediate;   Fair Recent;   Fair Remote;   Fair  Judgement:  Poor  Insight:  Lacking  Psychomotor Activity:  Decreased  Concentration:  Fair  Recall:  Fair  Akathisia:  No  Handed:  Right  AIMS (if indicated):     Assets:  Leisure Time Resilience  Sleep:  Number of Hours: 6   Current Medications: Current Facility-Administered Medications  Medication Dose Route Frequency Provider Last Rate Last Dose  . alum & mag hydroxide-simeth (MAALOX/MYLANTA) 200-200-20 MG/5ML suspension 30 mL  30 mL Oral Q4H PRN Cleotis Nipper, MD      . benztropine (COGENTIN) tablet 0.5 mg  0.5 mg Oral BID Nelly Rout, MD      . diphenhydrAMINE (BENADRYL) capsule 50 mg  50 mg Oral Once Kristeen Mans, NP      . feeding supplement (ENSURE COMPLETE) (ENSURE COMPLETE) liquid 237 mL  237 mL Oral BID BM Jeoffrey Massed, RD   237 mL at 11/14/13 0813  .  haloperidol (HALDOL) tablet 2 mg  2 mg Oral Q6H PRN Cleotis Nipper, MD       Or  . haloperidol lactate (HALDOL) injection 2 mg  2 mg Intramuscular Q6H PRN Cleotis Nipper, MD   2 mg at 11/13/13 1549  . haloperidol (HALDOL) tablet 2 mg  2 mg Oral BID Nelly Rout, MD      . haloperidol lactate (HALDOL) injection 2 mg  2 mg Intramuscular Once Kristeen Mans, NP      . hydrochlorothiazide (HYDRODIURIL) tablet 25 mg  25 mg Oral Daily Nanine Means, NP   25 mg at 11/14/13 5284  . hydrOXYzine  (ATARAX/VISTARIL) tablet 25 mg  25 mg Oral TID Nanine Means, NP   25 mg at 11/14/13 0953  . ibuprofen (ADVIL,MOTRIN) tablet 600 mg  600 mg Oral Q6H PRN Mojeed Akintayo      . LORazepam (ATIVAN) injection 2 mg  2 mg Intramuscular Once Kristeen Mans, NP      . LORazepam (ATIVAN) tablet 2 mg  2 mg Oral Once Nelly Rout, MD       Or  . LORazepam (ATIVAN) injection 2 mg  2 mg Intramuscular Once Nelly Rout, MD      . magnesium hydroxide (MILK OF MAGNESIA) suspension 30 mL  30 mL Oral Daily PRN Cleotis Nipper, MD      . multivitamin with minerals tablet 1 tablet  1 tablet Oral Daily Jeoffrey Massed, RD   1 tablet at 11/14/13 [phone]  . potassium chloride (K-DUR,KLOR-CON) CR tablet 10 mEq  10 mEq Oral Daily Nanine Means, NP   10 mEq at 11/14/13 0817  . traZODone (DESYREL) tablet 100 mg  100 mg Oral QHS,MR X 1 Nelly Rout, MD        Lab Results:  No results found for this or any previous visit (from the past 48 hour(s)).  Physical Findings: AIMS: Facial and Oral Movements Muscles of Facial Expression: None, normal Lips and Perioral Area: None, normal Jaw: None, normal Tongue: None, normal,Extremity Movements Upper (arms, wrists, hands, fingers): None, normal Lower (legs, knees, ankles, toes): None, normal, Trunk Movements Neck, shoulders, hips: None, normal, Overall Severity Severity of abnormal movements (highest score from questions above): None, normal Incapacitation due to abnormal movements: None, normal Patient's awareness of abnormal movements (rate only patient's report): No Awareness, Dental Status Current problems with teeth and/or dentures?: No Does patient usually wear dentures?: No  CIWA:  CIWA-Ar Total: 3 COWS:  COWS Total Score: 3  Treatment Plan Summary: Daily contact with patient to assess and evaluate symptoms and progress in treatment Medication management  Plan:  Review of chart, vital signs, medications, and notes. 1-Individual and group therapy 2-Medication  management for depression and anxiety:  Medications reviewed with the patient and Trazodone increased for sleep issues, Vistaril scheduled versus PRN 3-Coping skills for depression, anxiety 4-Continue crisis stabilization and management 5-Address health issues--monitoring vital signs, stable 6-Treatment plan in progress to prevent relapse of psychosis 7-Zoloft discontinued. Patient was started on Haldol 2 mg twice daily to help with mood stabilization and psychosis. Also patient started on Cogentin 0.5 mg twice a day for EPS. Patient states that he plans to take his medications as prescribed  Medical Decision Making Problem Points:  Established problem, worsening (2), Review of last therapy session (1) and Review of psycho-social stressors (1) Data Points:  Order Aims Assessment (2) Review and summation of old records (2) Review of new medications or change in dosage (2)  Detailed history was obtained from wife to help formulate diagnosis and treatment plan I certify that inpatient services furnished can reasonably be expected to improve the patient's condition.   Josephine Rudnick, 11/14/2013, 11:35 AM

## 2013-11-14 NOTE — [facility] Group Notes (Signed)
[facility] LCSW Group Therapy Note   11/14/2013 11 AM  Type of Therapy and Topic: Group Therapy: Feelings Around Returning Home & Establishing a Supportive Framework and Activity to Identify signs of Improvement or Decompensation   Participation Level: Adequate  Mood:  Appropriate  Description of Group:  Patients first processed thoughts and feelings about up coming discharge. These included fears of upcoming changes, lack of change, new living environments, judgements and expectations from others and overall stigma of MH issues. We then discussed what is a supportive framework? What does it look like feel like and how do I discern it from and unhealthy non-supportive network? Learn how to cope when supports are not helpful and don't support you. Discuss what to do when your family/friends are not supportive.   Therapeutic Goals Addressed in Processing Group:  1. Patient will identify one healthy supportive network that they can use at discharge. 2. Patient will identify one factor of a supportive framework and how to tell it from an unhealthy network. 3. Patient able to identify one coping skill to use when they do not have positive supports from others. 4. Patient will demonstrate ability to communicate their needs through discussion and/or role plays.  Summary of Patient Progress:  Pt attentive during group session as others processed their feelings/anxiety about discharge and described healthy supports. Patient chose two visuals to represent improvement as a path and also a coastal sunset. He described the long path/trek could have a huge payoff of seeing the sunset. Patient was less anxious in group today and less responsive to others.   Carney Bern, LCSW

## 2013-11-14 NOTE — Progress Notes (Signed)
Psychoeducational Group Note  Date:  11/14/2013 Time:  8:00 p.m.   Group Topic/Focus:  Wrap-Up Group:   The focus of this group is to help patients review their daily goal of treatment and discuss progress on daily workbooks.  Participation Level: Did Not Attend  Participation Quality:  Not Applicable  Affect:  Not Applicable  Cognitive:  Not Applicable  Insight:  Not Applicable  Engagement in Group: Not Applicable  Additional Comments:  The patient did not attend group since he was asleep in his room.   Hazle Coca S 11/14/2013, 8:57 PM

## 2013-11-14 NOTE — Progress Notes (Signed)
1: 1  for 1300   This RN requested to speak with pt's wife and f-in-law ( who are in bldg waiting room at 1000!!).Marland Kitchenalong with Dr. Lucianne Muss (present for Dr. Lolly Mustache ). Dr. Lucianne Muss explained to wife that pt's zoloft would be DC'd, that she had met with pt earlier this AM and instructed him that in order to be DC'd, he must take his medication, that the sx of mania are  No sleep, increased feelings of energy and risk-taking behaviors. Dr. Lucianne Muss explained to pt's wife that it is important for wife to take care of herself now, that she should go home now, go to sleep and prepare for the 12 hr drive ( back to [place]) when her husband is dc'd, for she will be the primary caregiver. Dr. Lucianne Muss said that pt's dx is unclear at present, but that his behaviors definitely present as acute post op vs bipolar manic, and that if it is confirmed he is bipolar, pt will have to take psychiatric medication the remainder of his. Per pt's wife, pt's PCP in [place] is Dr. Bynum Bellows # [phone] and this nurse will place a note in pt's chart, requesting SW call PCP tomorrow to discuss pt's continuing care needs. ELOS 48 hrs ( provided no emergencies and pt is compliant with meds) and this is explained to pt.  A Pt remains disorganized, easily agitated and requiiring prompting and cuiing and redirection, but " better" than yesterday.   R Safety is in place and 1:1 contd;

## 2013-11-14 NOTE — Progress Notes (Signed)
Patient ID: Francisco Hill, male   DOB: 1961/03/29, 52 y.o.   MRN: [id number] 11-14-13 @ 0900 nursing 1:1 note: D: pt ate his breakfast 100% and also got his nutritional supplement. He took in about 480cc of fluid and had a b/m this am. His did not take all of his mornings medications he refused the vistaril and the zoloft. A: RN told him the benefit of the refused medications and he continues to refuse them. Also this pt wife spoke with rn on the phone and she stated she is wanting a appt with the md. She was told that has not happened yet, but decided to come [facility] the [facility] to see the md. rn told the charge rn and the A/C. Also the medical director is speaking with the patient and the rn and the wife with the A/C. R: pt took the medications he originally refused.  the 1:1 continues.

## 2013-11-14 NOTE — Progress Notes (Signed)
Nursing 1:1 note D:Pt observed sitting in dayroom watching TV. RR even and unlabored. No distress noted. Pt denies SI/HI/AVH/Pain. Pt stated he felt good.   A: 1:1 observation continues for safety  R: pt remains safe

## 2013-11-14 NOTE — Progress Notes (Signed)
Patient continued to pace and fidget at decreased rate with time, continued to refuse offers of Trazodone. VS stable. Patient able to fall asleep approximately 2330. Benadryl held at present. Patient observed sleeping respirations even, unlabored.  Patient safety maintained, Q 15 checks continue. One to one continues.

## 2013-11-14 NOTE — Progress Notes (Signed)
Patient observed resting quietly, eyes closed with respirations even and unlabored.  Q 15 safety checks continue.  One to one continues.

## 2013-11-14 NOTE — Progress Notes (Signed)
Patient ID: [name], male   DOB: [birth year], 52 y.o.   MRN: [id number] Psychoeducational Group Note  Date:  11/14/2013 Time:  1000am  Group Topic/Focus:  Making Healthy Choices:   The focus of this group is to help patients identify negative/unhealthy choices they were using prior to admission and identify positive/healthier coping strategies to replace them upon discharge.  Participation Level:  Active  Participation Quality:  Appropriate  Affect:  Anxious  Cognitive:  Confused  Insight:  Supportive  Engagement in Group:  Supportive  Additional Comments:  Inventory and Psychoeducational group   Valente David 11/14/2013,10:42 AM

## 2013-11-15 DIAGNOSIS — F29 Unspecified psychosis not due to a substance or known physiological condition: Secondary | ICD-10-CM

## 2013-11-15 DIAGNOSIS — F22 Delusional disorders: Secondary | ICD-10-CM | POA: Diagnosis present

## 2013-11-15 DIAGNOSIS — F39 Unspecified mood [affective] disorder: Secondary | ICD-10-CM

## 2013-11-15 NOTE — Progress Notes (Signed)
Patient ID: Francisco Hill, male   DOB: Mar 22, 1961, 52 y.o.   MRN: [id number] [facility] MD Progress Note  11/15/2013 10:39 AM Francisco Hill  MRN:  [id number] Subjective: "I have been taking my medications as prescribed by Dr. Lucianne Muss yesterday and I am feeling better already. I think I was fearful for my children and was thinking that my family does not love me but now I know better. I just want to get well and go back home.'' Objective: Patient reports that he has been sleeping much better, feeling less anxious and agitated. He reports decreased mood lability, racing thoughts, worries and paranoid thinking. He has been taking the medications offered to him and has not endorsed any adverse reactions. He also reports that he has been participating in groups and hope to be discharged home soon. DSM5:   Axis I: Anxiety Disorder NOS, Mood Disorder NOS and Psychotic Disorder NOS Axis II: Deferred Axis III:  Past Medical History  Diagnosis Date  . Anxiety   . Depression    Axis IV: other psychosocial or environmental problems, problems related to social environment and problems with primary support group Axis V: 41-50 serious symptoms  ADL's:  Intact  Sleep: Poor  Appetite:  Poor  Suicidal Ideation:  Denies  Homicidal Ideation:  Denies   Psychiatric Specialty Exam: Review of Systems  Constitutional: Negative.   HENT: Negative.   Eyes: Negative.   Respiratory: Negative.   Cardiovascular: Negative.   Gastrointestinal: Negative.   Genitourinary: Negative.   Musculoskeletal: Negative.   Skin: Negative.   Neurological: Negative.   Endo/Heme/Allergies: Negative.   Psychiatric/Behavioral: Positive for hallucinations. Negative for depression. The patient is nervous/anxious.     Blood pressure 112/71, pulse 56, temperature 97.5 F (36.4 C), temperature source Oral, resp. rate 17, height 5\' 8"  (1.727 m), weight 72.122 kg (159 lb).Body mass index is 24.18 kg/(m^2).  General Appearance: fairly  groomed  Patent attorney::  good  Speech:  Clear and coherent  Volume:  normal  Mood: Anxious   Affect:  Congruent  Thought Process: Coherent and Linear  Orientation:  Full (Time, Place, and Person)  Thought Content:  Hallucinations: Auditory  Suicidal Thoughts:  No  Homicidal Thoughts:  No  Memory:  Immediate;   Fair Recent;   Fair Remote;   Fair  Judgement: marginal  Insight:  shallow  Psychomotor Activity:  Decreased  Concentration:  Fair  Recall:  Fair  Akathisia:  No  Handed:  Right  AIMS (if indicated):     Assets:  Leisure Time Resilience  Sleep:  Number of Hours: 6.5   Current Medications: Current Facility-Administered Medications  Medication Dose Route Frequency Provider Last Rate Last Dose  . alum & mag hydroxide-simeth (MAALOX/MYLANTA) 200-200-20 MG/5ML suspension 30 mL  30 mL Oral Q4H PRN Cleotis Nipper, MD      . benztropine (COGENTIN) tablet 0.5 mg  0.5 mg Oral BID Nelly Rout, MD   0.5 mg at 11/15/13 0807  . feeding supplement (ENSURE COMPLETE) (ENSURE COMPLETE) liquid 237 mL  237 mL Oral BID BM Jeoffrey Massed, RD   237 mL at 11/15/13 1000  . haloperidol (HALDOL) tablet 2 mg  2 mg Oral BID Nelly Rout, MD   2 mg at 11/15/13 0807  . hydrochlorothiazide (HYDRODIURIL) tablet 25 mg  25 mg Oral Daily Nanine Means, NP   25 mg at 11/15/13 0865  . hydrOXYzine (ATARAX/VISTARIL) tablet 25 mg  25 mg Oral TID Nanine Means, NP   25 mg at 11/15/13  1610  . ibuprofen (ADVIL,MOTRIN) tablet 600 mg  600 mg Oral Q6H PRN Geanette Buonocore      . LORazepam (ATIVAN) injection 2 mg  2 mg Intramuscular Once Kristeen Mans, NP      . magnesium hydroxide (MILK OF MAGNESIA) suspension 30 mL  30 mL Oral Daily PRN Cleotis Nipper, MD      . multivitamin with minerals tablet 1 tablet  1 tablet Oral Daily Jeoffrey Massed, RD   1 tablet at 11/15/13 9604  . potassium chloride (K-DUR,KLOR-CON) CR tablet 10 mEq  10 mEq Oral Daily Nanine Means, NP   10 mEq at 11/15/13 0807  . traZODone (DESYREL) tablet  100 mg  100 mg Oral QHS,MR X 1 Nelly Rout, MD   100 mg at 11/14/13 2114    Lab Results:  No results found for this or any previous visit (from the past 48 hour(s)).  Physical Findings: AIMS: Facial and Oral Movements Muscles of Facial Expression: None, normal Lips and Perioral Area: None, normal Jaw: None, normal Tongue: None, normal,Extremity Movements Upper (arms, wrists, hands, fingers): None, normal Lower (legs, knees, ankles, toes): None, normal, Trunk Movements Neck, shoulders, hips: None, normal, Overall Severity Severity of abnormal movements (highest score from questions above): None, normal Incapacitation due to abnormal movements: None, normal Patient's awareness of abnormal movements (rate only patient's report): No Awareness, Dental Status Current problems with teeth and/or dentures?: No Does patient usually wear dentures?: No  CIWA:  CIWA-Ar Total: 3 COWS:  COWS Total Score: 3  Treatment Plan Summary: Daily contact with patient to assess and evaluate symptoms and progress in treatment Medication management  Plan:  Review of chart, vital signs, medications, and notes. 1-Individual and group therapy 2-Medication management for depression and anxiety:  Medications reviewed with the patient and Trazodone increased for sleep issues, Vistaril scheduled versus PRN 3-Coping skills for depression, anxiety 4-Continue crisis stabilization and management 5-Address health issues--monitoring vital signs, stable 6-Treatment plan in progress to prevent relapse of psychosis 7-Continue Haldol 2 mg twice daily to help with mood stabilization and psychosis. Continue Cogentin 0.5 mg twice a day for EPS. Patient states that he plans to take his medications as prescribed  Medical Decision Making Problem Points:  Established problem, improving (1), Review of last therapy session (1) and Review of psycho-social stressors (1) Data Points:  Order Aims Assessment (2) Review and summation  of old records (2) Review of new medications or change in dosage (2) Detailed history was obtained from wife to help formulate diagnosis and treatment plan I certify that inpatient services furnished can reasonably be expected to improve the patient's condition.   Thedore Mins , MD  11/15/2013, 10:39 AM

## 2013-11-15 NOTE — Progress Notes (Signed)
LCSW received information to call patient wife regarding update and plan for patient. Spoke with wife and answered all questions regarding DC and treatment plan. Wife very appreciative and engaged in care for husband. Both patient and wife gave permission to speak with PCP in [place] regarding treatment as PCP had requested. Call placed and LCSW spoke with MD, but due to seeing patients, MD will call LCSW back to get update and also have LCSW retain information for medical records to be sent regarding follow up.  Ashley Jacobs, MSW, LCSW Clinical Lead [phone]

## 2013-11-15 NOTE — Progress Notes (Signed)
[facility] Post 1:1 Observation Documentation  For the first (8) hours following discontinuation of 1:1 precautions, a progress note entry by nursing staff should be documented at least every 2 hours, reflecting the patient's behavior, condition, mood, and conversation.  Use the progress notes for additional entries.  Time 1:1 discontinued:  09:47 am  Patient's Behavior:  Cooperative  Patient's Condition:  No change  Patient's Conversation:  Looking forward to lunch   Francisco Hill Mae 11/15/2013, 2:58 PM

## 2013-11-15 NOTE — [facility] Group Notes (Signed)
[facility] LCSW Group Therapy  11/15/2013 3:25 PM  Type of Therapy:  Group Therapy  Participation Level:  Active  Participation Quality:  Attentive, Sharing and Supportive  Affect:  Blunted  Cognitive:  Alert, Appropriate and Oriented  Insight:  Developing/Improving  Engagement in Therapy:  Engaged  Modes of Intervention:  Discussion and Exploration  Summary of Progress/Problems:  Tajh did not more listening and support of others than talking about his own obstacles.  He does share that one obstacle he had that brought him to being admitted to [facility] is his distrust in his family love for himself.  He shared that he did not believe his family loved him until he was admitted and they have come to see him and showed concern. He shared he will be more open with his family now that he understands their support and love.  He shares he wants to overcome this obstacle of isolation and depression with going to [place] and getting more involved in prayer and the church family.  This will overall help him feel supported and more hopeful as he shared to others in group.  Nail, [name] 11/15/2013, 3:25 PM

## 2013-11-15 NOTE — Progress Notes (Signed)
[facility] Post 1:1 Observation Documentation  For the first (8) hours following discontinuation of 1:1 precautions, a progress note entry by nursing staff should be documented at least every 2 hours, reflecting the patient's behavior, condition, mood, and conversation.  Use the progress notes for additional entries.  Time 1:1 discontinued:  09:47 am  Patient's Behavior:  Cooperative  Patient's Condition:  Somewhat anxious, but cooperative  Patient's Conversation:  In group  Francisco Hill 11/15/2013, 2:59 PM

## 2013-11-15 NOTE — Progress Notes (Signed)
D: Pt denies SI/HI/AVH. Pt is pleasant and cooperative. Pt in better spirits today. Pt said he talked to his father-in-law and that helped him get organized on what his plans would be upon discharge. Pt felt good because he was able to see his wife today. Pt spoke to Clinical research associate and after our talk pt stated he felt much better and very hopeful.  A: Pt was offered support and encouragement. Pt was given scheduled medications. Pt was encourage to attend groups. Q 15 minute checks were done for safety.  R:Pt attends groups and interacts well with peers and staff. Pt is taking medication. Pt has no complaints.Pt receptive to treatment and safety maintained on unit.

## 2013-11-15 NOTE — Tx Team (Signed)
  Interdisciplinary Treatment Plan Update   Date Reviewed:  11/15/2013  Time Reviewed:  10:22 AM  Progress in Treatment:   Attending groups: Yes Participating in groups: Yes Taking medication as prescribed: Yes  Tolerating medication: Yes Family/Significant other contact made: Yes Patient understands diagnosis: Yes AEB reporting he understands what his son has gone through and now can relate and work through his own depression and anxiety Discussing patient identified problems/goals with staff: Yes Medical problems stabilized or resolved: Yes Denies suicidal/homicidal ideation: Yes In tx team Patient has not harmed self or others: Yes  For review of initial/current patient goals, please see plan of care.  Estimated Length of Stay:  1 more day, DC 12/20  Reason for Continuation of Hospitalization: Medication stabilization DC 1:1 as behavior has improved.  New Problems/Goals identified:  N/A  Discharge Plan or Barriers:   Patient plans to return to [place] and follow up with current provider.  Additional Comments: Patient mood has improved greatly and he reports he is ready to DC home. Plans to DC 1:1 today and monitor as needed behavioral outburst.  Attendees:  Signature: Thedore Mins, MD 11/15/2013 10:22 AM   Signature: Marilynne Halsted, LCSW 11/15/2013 10:22 AM  Signature: Fransisca Kaufmann, NP 11/15/2013 10:22 AM  Signature: Joslyn Devon, RN 11/15/2013 10:22 AM  Signature: Liborio Nixon, RN 11/15/2013 10:22 AM  Signature:  11/15/2013 10:22 AM  Signature:   11/15/2013 10:22 AM  Signature:    Signature:    Signature:    Signature:    Signature:    Signature:      Scribe for Treatment Team:   Walker Kehr, LCSW  11/15/2013 10:22 AM

## 2013-11-15 NOTE — Progress Notes (Signed)
Nursing 1:1 note D:Pt observed sleeping in bed with eyes closed. RR even and unlabored. No distress noted. A: 1:1 observation continues for safety  R: pt remains safe  

## 2013-11-15 NOTE — [facility] Group Notes (Signed)
[facility] LCSW Aftercare Discharge Planning Group Note   11/15/2013 10:16 AM  Participation Quality:  Active and Engaged   Mood/Affect:  Appropriate and Bright  Depression Rating:  None reported  Anxiety Rating:  None reported  Thoughts of Suicide:  No Will you contract for safety?   Yes  Current AVH:  No  Plan for Discharge/Comments:  Patient plans to DC on Tuesday back to his family which he reports are good supports for patient.  Patient has MD in [place] and plans to return to current provider. Patient reports he is visiting family in [place] and will return to [place] then back to [place] next week.  Transportation Means: Family will come and pick up  Supports:  Family and friends back in [place]  [place], Francisco Hill

## 2013-11-15 NOTE — Progress Notes (Signed)
[facility] Post 1:1 Observation Documentation  For the first (8) hours following discontinuation of 1:1 precautions, a progress note entry by nursing staff should be documented at least every 2 hours, reflecting the patient's behavior, condition, mood, and conversation.  Use the progress notes for additional entries.  Time 1:1 discontinued:  09:47   Patient's Behavior:  Cooperative, focused  Patient's Condition:  Ambulating with cane, interacting with peers in the dayroom  Patient's Conversation:  I am doing fine.  I feel better and apologize for my previous behaviors.    Izola Price Mae 11/15/2013, 11:41 AM

## 2013-11-15 NOTE — Progress Notes (Signed)
[facility] Post 1:1 Observation Documentation  For the first (8) hours following discontinuation of 1:1 precautions, a progress note entry by nursing staff should be documented at least every 2 hours, reflecting the patient's behavior, condition, mood, and conversation.  Use the progress notes for additional entries.  Time 1:1 discontinued: 09.47  Patient's Behavior:  Calm, cooperative  Patient's Condition:  Ambulating with cane in hallway, interacting with others  Patient's Conversation: pt stated he is okay, attended groups, enjoyed them. No complaints and this time.    Francisco Hill M 11/15/2013, 5:50 PM

## 2013-11-15 NOTE — Progress Notes (Signed)
[facility] Post 1:1 Observation Documentation  For the first (8) hours following discontinuation of 1:1 precautions, a progress note entry by nursing staff should be documented at least every 2 hours, reflecting the patient's behavior, condition, mood, and conversation.  Use the progress notes for additional entries.  Time 1:1 discontinued:  9:47  Patient's Behavior:  Calm and cooperative   Patient's Condition:  Anxious, and cautious about taking medication. Very inquisitive about what he is receiving and why. Pt believes medications aren't what are making him better, but was willing to take them  Patient's Conversation:  Looking forward to having dinner with his wife  Kellie Moor 11/15/2013, 5:47pm

## 2013-11-15 NOTE — [facility] Group Notes (Signed)
Adult Psychoeducational Group Note  Date:  11/15/2013 Time:  8:55 PM  Group Topic/Focus:  Wrap-Up Group:   The focus of this group is to help patients review their daily goal of treatment and discuss progress on daily workbooks.  Participation Level:  Active  Participation Quality:  Appropriate  Affect:  Appropriate  Cognitive:  Appropriate  Insight: Appropriate  Engagement in Group:  Engaged  Modes of Intervention:  Discussion  Additional Comments:  Rayson stated his day was good.  He had a visit from his wife, father-in-law and brother-in-law and he also stated he met some nice staff.  Caroll Rancher A 11/15/2013, 8:55 PM

## 2013-11-15 NOTE — Progress Notes (Signed)
Nursing 1:1 note D:Pt observed sitting in bed putting clothes on. RR even and unlabored. No distress noted A: 1:1 observation continues for safety  R: pt remains safe

## 2013-11-16 MED ORDER — LABETALOL HCL 200 MG PO TABS
200.0000 mg | ORAL_TABLET | Freq: Three times a day (TID) | ORAL | Status: DC
  Administered 2013-11-16 – 2013-11-17 (×3): 200 mg via ORAL
  Filled 2013-11-16 (×6): qty 1
  Filled 2013-11-16 (×2): qty 2
  Filled 2013-11-16 (×2): qty 1

## 2013-11-16 NOTE — Progress Notes (Signed)
Patient ID: Francisco Hill, male   DOB: 21-Dec-1960, 52 y.o.   MRN: [id number] D: Patient presents with irritable mood; he refused his psychotrophic medications this morning. Patient met with treatment team and he stated his reasoning for not taking his meds.  Patient stated, "I'm not putting anything in my body that would cause me harm.  Jesus Christ alone will heal me."  Patient become agitated when talking about his faith.  He was tearful at times and his speech was pressured.  He denies any SI/HI/AVH.  Patient escalated earlier today when another patient became loud.  Patient was getting his blood pressure taken and he exclaimed, "In the name of Jesus Christ."  Patient went into day room and nurse approached him.  Patient stated, "I'll tell you what's wrong.  I'm just trying to get the word of Jesus Christ out!"  Patient then slammed his fist down on the coffee container.  Patient was reminded that MD is agreeable for his discharge if his behavior is appropriate.  Patient agreed to stay back from lunch to attempt to calm himself.  A: continue to monitor medication management and MD orders.  Safety checks completed every 15 minutes per protocol.  R: Patient needs redirection at times when behavior escalates.

## 2013-11-16 NOTE — Progress Notes (Signed)
Adult Psychoeducational Group Note  Date:  11/16/2013 Time:  11:02 PM  Group Topic/Focus:  Wrap-Up Group:   The focus of this group is to help patients review their daily goal of treatment and discuss progress on daily workbooks.  Participation Level:  Active  Participation Quality:  Attentive  Affect:  Appropriate  Cognitive:  Alert  Insight: Good  Engagement in Group:  Engaged  Modes of Intervention:  Discussion  Additional Comments:  Patient says that he had a great day, he says he has no doubt, no pain and no hopelessness. Pt also states that he is working on his discharge  [facility] 11/16/2013, 11:02 PM

## 2013-11-16 NOTE — Plan of Care (Signed)
Rec'd consult request for internal medicine regarding hypertension.  The requesting physician, Dr. Gelene Mink, stated that they intended to call the consultation during the day but this got delayed.  Reason for consultation is hypertension to SBP 180s.  He stated that the patient denies any symptoms related to hypertension including vision changes, headaches, etc.   After review of the medical record I called back and spoke with [name] RN, recommended initiation of labetalol 200 mg tid and continuation of previously begin HCTZ 25 mg daily.  I recommended obtaining consultation with the day hospitalist provider in the AM.  I provided call back precautions if the patient develops dizziness, vision changes, headaches, or other symptoms possibly related to HTN.  Mr. Francisco Hill agreed with this plan.  Maxie Barb, MD

## 2013-11-16 NOTE — Progress Notes (Signed)
LCSW, MD and RN met with patient in room today as he was to be DC but refusing medications. Patient shows behaviors of being hyper-religious and wanting to do the will of God, by not taking medications. He is calm, but voice elevates and he becomes tearful when defending his reasoning behind not taking medications.  Patient signed 72 hour and MD is not forcing medications at this time. Patient if stable and  Does not hurt self or others will DC once 72 hours is up.  LCSW to contact wife with update. MD PCP in [place] called back and LCSW to call and follow up this afternoon.  Ashley Jacobs, MSW, LCSW Clinical Lead [phone]

## 2013-11-16 NOTE — Progress Notes (Signed)
Patient ID: Francisco Hill, male   DOB: 08-21-1961, 52 y.o.   MRN: [id number] [facility] MD Progress Note  11/16/2013 10:50 AM Francisco Hill  MRN:  [id number] Subjective: "I am not going to take those psychotropics anymore, It has not affected me in a bad way but I have seen what it has does to people who take them. I believe in the will of God and healing of Jesus Christ.'' Objective: Patient has been refusing to take his medications. He is anxious, tearful, easily agitated and has become more hyper religious. He states that only the power of Jesus Christ can heal him not the medications prescribed at the hospital. Patient did not get much sleep last night, he refused he did not take his sleeping tablet. Patient has minimal insight into his mental illness. DSM5: Axis I: Anxiety Disorder NOS, Mood Disorder NOS and Psychotic Disorder NOS Axis II: Deferred Axis III:  Past Medical History  Diagnosis Date  . Anxiety   . Depression    Axis IV: other psychosocial or environmental problems, problems related to social environment and problems with primary support group Axis V: 41-50 serious symptoms  ADL's:  Intact  Sleep: Poor  Appetite:  Poor  Suicidal Ideation:  Denies  Homicidal Ideation:  Denies   Psychiatric Specialty Exam: Review of Systems  Constitutional: Negative.   HENT: Negative.   Eyes: Negative.   Respiratory: Negative.   Cardiovascular: Negative.   Gastrointestinal: Negative.   Genitourinary: Negative.   Musculoskeletal: Negative.   Skin: Negative.   Neurological: Negative.   Endo/Heme/Allergies: Negative.   Psychiatric/Behavioral: Positive for hallucinations. Negative for depression. The patient is nervous/anxious.     Blood pressure 154/76, pulse 51, temperature 98 F (36.7 C), temperature source Oral, resp. rate 16, height 5\' 8"  (1.727 m), weight 72.122 kg (159 lb).Body mass index is 24.18 kg/(m^2).  General Appearance: fairly groomed  Patent attorney::  good  Speech:   Clear and coherent  Volume:  normal  Mood: Anxious   Affect:  Congruent  Thought Process: Coherent and Linear  Orientation:  Full (Time, Place, and Person)  Thought Content:  Hallucinations: Auditory  Suicidal Thoughts:  No  Homicidal Thoughts:  No  Memory:  Immediate;   Fair Recent;   Fair Remote;   Fair  Judgement: marginal  Insight:  shallow  Psychomotor Activity:  Decreased  Concentration:  Fair  Recall:  Fair  Akathisia:  No  Handed:  Right  AIMS (if indicated):     Assets:  Leisure Time Resilience  Sleep:  Number of Hours: 2.5   Current Medications: Current Facility-Administered Medications  Medication Dose Route Frequency Provider Last Rate Last Dose  . alum & mag hydroxide-simeth (MAALOX/MYLANTA) 200-200-20 MG/5ML suspension 30 mL  30 mL Oral Q4H PRN Cleotis Nipper, MD      . benztropine (COGENTIN) tablet 0.5 mg  0.5 mg Oral BID Nelly Rout, MD   0.5 mg at 11/15/13 1632  . feeding supplement (ENSURE COMPLETE) (ENSURE COMPLETE) liquid 237 mL  237 mL Oral BID BM Jeoffrey Massed, RD   237 mL at 11/16/13 0941  . haloperidol (HALDOL) tablet 2 mg  2 mg Oral BID Nelly Rout, MD   2 mg at 11/15/13 1632  . hydrochlorothiazide (HYDRODIURIL) tablet 25 mg  25 mg Oral Daily Nanine Means, NP   25 mg at 11/15/13 7829  . hydrOXYzine (ATARAX/VISTARIL) tablet 25 mg  25 mg Oral TID Nanine Means, NP   25 mg at 11/15/13 1632  . ibuprofen (  ADVIL,MOTRIN) tablet 600 mg  600 mg Oral Q6H PRN Markon Jares      . LORazepam (ATIVAN) injection 2 mg  2 mg Intramuscular Once Kristeen Mans, NP      . magnesium hydroxide (MILK OF MAGNESIA) suspension 30 mL  30 mL Oral Daily PRN Cleotis Nipper, MD      . multivitamin with minerals tablet 1 tablet  1 tablet Oral Daily Jeoffrey Massed, RD   1 tablet at 11/16/13 4782  . potassium chloride (K-DUR,KLOR-CON) CR tablet 10 mEq  10 mEq Oral Daily Nanine Means, NP   10 mEq at 11/16/13 0807  . traZODone (DESYREL) tablet 100 mg  100 mg Oral QHS,MR X 1 Nelly Rout, MD   100 mg at 11/14/13 2114    Lab Results:  No results found for this or any previous visit (from the past 48 hour(s)).  Physical Findings: AIMS: Facial and Oral Movements Muscles of Facial Expression: None, normal Lips and Perioral Area: None, normal Jaw: None, normal Tongue: None, normal,Extremity Movements Upper (arms, wrists, hands, fingers): None, normal Lower (legs, knees, ankles, toes): None, normal, Trunk Movements Neck, shoulders, hips: None, normal, Overall Severity Severity of abnormal movements (highest score from questions above): None, normal Incapacitation due to abnormal movements: None, normal Patient's awareness of abnormal movements (rate only patient's report): No Awareness, Dental Status Current problems with teeth and/or dentures?: No Does patient usually wear dentures?: No  CIWA:  CIWA-Ar Total: 3 COWS:  COWS Total Score: 3  Treatment Plan Summary: Daily contact with patient to assess and evaluate symptoms and progress in treatment Medication management  Plan:  Review of chart, vital signs, medications, and notes. 1-Individual and group therapy 2-Medication management for depression and anxiety:  Medications reviewed with the patient and Trazodone increased for sleep issues, Vistaril scheduled versus PRN 3-Coping skills for depression, anxiety 4-Continue crisis stabilization and management 5-Address health issues--monitoring vital signs, stable 6-Treatment plan in progress to prevent relapse of psychosis 7-Continue Haldol 2 mg twice daily to help with mood stabilization and psychosis. Continue Cogentin 0.5 mg twice a day for EPS. 8- Encourage patient to take his medication as prescribed.  Medical Decision Making Problem Points:  Established problem, improving (1), Review of last therapy session (1) and Review of psycho-social stressors (1) Data Points:  Order Aims Assessment (2) Review and summation of old records (2) Review of new medications  or change in dosage (2) Detailed history was obtained from wife to help formulate diagnosis and treatment plan I certify that inpatient services furnished can reasonably be expected to improve the patient's condition.   Thedore Mins , MD  11/16/2013, 10:50 AM

## 2013-11-16 NOTE — Progress Notes (Signed)
[facility] Post 1:1 Observation Documentation  For the first (8) hours following discontinuation of 1:1 precautions, a progress note entry by nursing staff should be documented at least every 2 hours, reflecting the patient's behavior, condition, mood, and conversation.  Use the progress notes for additional entries.  Time 1:1 discontinued:  0947  Patient's Behavior:  Calm, cooperative  Patient's Condition:  Sitting in dayroom interacting  Patient's Conversation:  feel better today  Delos Haring 11/15/2013 1947

## 2013-11-16 NOTE — Progress Notes (Signed)
Patient ID: Francisco Hill, male   DOB: 03-21-1961, 52 y.o.   MRN: [id number] Patient's blood pressure taken at 1800.  Reading was 199/106.  Taken manually 185/80.  Doctor P., doctor on call, was notified of same.  Patient was given 25 mg HCTZ which he had originally refused this am.  Dr. Demetrius Charity. Ordered an internal medical consult for patient.  Patient is not complaining of any symptoms.

## 2013-11-16 NOTE — [facility] Group Notes (Signed)
[facility] LCSW Group Therapy  11/16/2013 12:48 PM  Type of Therapy:  Group Therapy  Participation Level:  Active  Participation Quality:  Intrusive and Monopolizing  Affect:  Labile  Cognitive:  Alert and Oriented  Insight:  Limited  Engagement in Therapy:  Engaged  Modes of Intervention:  Discussion, Exploration and Problem-solving  Summary of Progress/Problems:  Francisco Hill was very labile in group being very calm and respectful while others talked and then randomly becoming hyper religious and upset that he was forced to take medication and destroy the body Jesus Christ gave to him.  He would raise his voice, become red in the face and the apologize.  He was unable to relate to topic of feeling around dx, sharing he has never been labeled and his admission is completely different then everyone else.  Members of group attempted to support him and engage him in conversation, but he was resistant and not able to relate.  Patient appears to be regressing from yesterdays group observed in mood and affect changes along with the religion component.    Francisco Hill, [name] 11/16/2013, 12:48 PM

## 2013-11-16 NOTE — Progress Notes (Signed)
Pt desk was found to be loose and the screws out . Screws were removed from the room with the 1st shift nurse and tech.

## 2013-11-16 NOTE — Progress Notes (Signed)
D: Pt denies SI/HI/AVH. Pt is pleasant and cooperative.  Pt stated he would be compliant with all his meds. Pt apologized for not taking meds earlier during his stay. Pt continues to be hyper-religous.   A: Pt was offered support and encouragement. Pt was given scheduled medications. Pt was encourage to attend groups. Q 15 minute checks were done for safety.   R:Pt attends groups and interacts well with peers and staff. Pt is taking medication. Pt has no complaints at this time.Pt receptive to treatment and safety maintained on unit.

## 2013-11-17 ENCOUNTER — Encounter (HOSPITAL_COMMUNITY): Payer: Self-pay | Admitting: Internal Medicine

## 2013-11-17 DIAGNOSIS — I10 Essential (primary) hypertension: Secondary | ICD-10-CM

## 2013-11-17 DIAGNOSIS — F22 Delusional disorders: Secondary | ICD-10-CM

## 2013-11-17 DIAGNOSIS — F315 Bipolar disorder, current episode depressed, severe, with psychotic features: Secondary | ICD-10-CM

## 2013-11-17 DIAGNOSIS — R7309 Other abnormal glucose: Secondary | ICD-10-CM

## 2013-11-17 DIAGNOSIS — E785 Hyperlipidemia, unspecified: Secondary | ICD-10-CM

## 2013-11-17 DIAGNOSIS — R739 Hyperglycemia, unspecified: Secondary | ICD-10-CM

## 2013-11-17 DIAGNOSIS — F4323 Adjustment disorder with mixed anxiety and depressed mood: Principal | ICD-10-CM

## 2013-11-17 MED ORDER — ASPIRIN EC 325 MG PO TBEC
325.0000 mg | DELAYED_RELEASE_TABLET | Freq: Two times a day (BID) | ORAL | Status: DC
  Administered 2013-11-17 – 2013-11-18 (×3): 325 mg via ORAL
  Filled 2013-11-17 (×6): qty 1

## 2013-11-17 MED ORDER — LABETALOL HCL 100 MG PO TABS
100.0000 mg | ORAL_TABLET | Freq: Three times a day (TID) | ORAL | Status: DC
  Administered 2013-11-17 – 2013-11-18 (×2): 100 mg via ORAL
  Filled 2013-11-17 (×7): qty 1

## 2013-11-17 MED ORDER — ASPIRIN 325 MG PO TABS
ORAL_TABLET | ORAL | Status: AC
  Filled 2013-11-17: qty 1

## 2013-11-17 NOTE — Progress Notes (Signed)
Patient ID: Francisco Hill, male   DOB: 1961-05-25, 52 y.o.   MRN: [id number] D: pt. In room most of the evening except for group. Pt. Reports day been "great, had visit with my wife, I'm going home tomorrow" "I take medication and my mind is better than it has been in a while" A: Writer introduced self to client provided emotional support by listening and discussed night med administration time. Pt. Will be monitored q12min for safety. R: Pt. Is safe on the unit.

## 2013-11-17 NOTE — Consult Note (Addendum)
[facility] Medical Consultation  Francisco Hill OZH:[id number] DOB: [birth year] DOA: 11/10/2013 PCP: No primary provider on file.   Requesting physician: Thedore Mins Date of consultation: 11/17/2013 Reason for consultation: high blood pressure  Impression/Recommendations Principal Problem:   Adjustment disorder with mixed anxiety and depressed mood Active Problems:   Bipolar disorder, curr episode depressed, severe, w/psychotic features   Delusional disorder   Essential hypertension, benign   Principal Problem:   Adjustment disorder with mixed anxiety and depressed mood Active Problems:   Bipolar disorder, curr episode depressed, severe, w/psychotic features   Delusional disorder   Essential hypertension, benign   High blood pressure, appears blood pressure has improved with initiation of hydrochlorothiazide and labetalol.  No symptoms suggestive of hypertensive urgency.   -  Check BP q8h and adjust medications as needed for goal BP of 120-130 systolic -  Recommend repeat BMP in 1 week to check creatinine and potassium -  Blood pressures low normal so will decrease labetalol to 100mg  TID  Arrhythmia, generally regular rhythm on exam -  Check ECG  HLD, managed by PCP -  Continue diet and exercise changes  Elevated blood glucose -  Check A1c  Anxiety, depression -  Per psychiatry  Recent hip replacement -  Continue aspirin 325mg  BID for at least 1 month from the time of surgery (through 11/23/2013)  I will continue to follow BPs and adjust medications accordingly.  Please contact me if I can be of assistance in the meanwhile. Thank you for this consultation.  Chief Complaint:  High blood pressure  HPI:  The patient is a 52 year old male with history of anxiety and depression, arrhythmia, and hyperlipidemia who was admitted to [facility] on 11/11/2013 because of stress and hyperreligiousity.  He has a family history of schizophrenia.  During his  admission, his blood pressures have been elevated to the 190s/100s.  He was started on HCTZ.  Internal medicine has been consulted for blood pressure management.  Last night, colleague recommended adding labetalol 200mg  TID.  Patient denies headache, chest pain, shortness of breath.  He states he has been seen by his primary care Doctor about every 6 months. He has never been told he has high blood pressure. He does have high cholesterol, however, in discussion with his doctor, he is trying diet and exercise for management. He was told he has a mild heart arrhythmia, but he is not sure what it is. He was not started on any blood thinners her heart rhythm and medications. His doctor is monitoring this. He states he is concerned that he had hip replacement surgery less than a month ago and that he is supposed to be taking aspirin twice daily for DVT prevention.    Labs at the time of admission were notable for potassium 3.4, creatinine 0.78, elevated glucose 141.  CBC was wnl.  Alcohol level neg and UDS neg.    Review of Systems:   Denies fevers, chills, weight loss or gain, changes to hearing and vision.  Denies rhinorrhea, sinus congestion, sore throat.  Denies chest pain and palpitations.  Denies SOB, wheezing, cough.  Denies nausea, vomiting, constipation, diarrhea.  Denies dysuria, frequency, urgency, polyuria, polydipsia.  Denies hematemesis, blood in stools, melena, abnormal bruising or bleeding.  Denies lymphadenopathy.  Denies arthralgias, myalgias.  Denies skin rash or ulcer.  Denies lower extremity edema.  Denies focal numbness, weakness, slurred speech, facial droop.  States he has some confusion recently.  [facility]  and depression.    Past Medical History  Diagnosis Date  . Anxiety   . Depression   . Arrhythmia   . Hyperlipidemia    Past Surgical History  Procedure Laterality Date  . Hip surgery Left 10/23/2013  . Joint replacement Right     Social History:  reports that he has never smoked. He has never used smokeless tobacco. He reports that he does not drink alcohol or use illicit drugs.  No Known Allergies Family History  Problem Relation Age of Onset  . Schizophrenia Son   . High blood pressure Father   . Stroke Father     Prior to Admission medications   Medication Sig Start Date End Date Taking? Authorizing Provider  aspirin 81 MG tablet Take 324 mg by mouth 2 (two) times daily.   Yes Historical Provider, MD  Multiple Vitamin (MULTIVITAMIN WITH MINERALS) TABS tablet Take 1 tablet by mouth daily.   Yes Historical Provider, MD  Omega-3 Fatty Acids (FISH OIL PO) Take 1 capsule by mouth daily.   Yes Historical Provider, MD  oxycodone (OXY-IR) 5 MG capsule Take 5 mg by mouth every 4 (four) hours as needed for pain.   Yes Historical Provider, MD   Physical Exam: Blood pressure 153/83, pulse 85, temperature 98 F (36.7 C), temperature source Oral, resp. rate 16, height 5\' 8"  (1.727 m), weight 72.122 kg (159 lb). Filed Vitals:   11/16/13 1208 11/16/13 1800 11/16/13 1805 11/16/13 2151  BP: 174/84 199/106 185/80 153/83  Pulse: 91 92  85  Temp:      TempSrc:      Resp:      Height:      Weight:         General:  Caucasian male, no acute distress  Eyes:  PERRL, anicteric, non-injected.  ENT:  Nares clear.  OP clear, non-erythematous without plaques or exudates.  MMM.  Neck:  Supple without TM or JVD.    Lymph:  No cervical, supraclavicular, or submandibular LAD.  Cardiovascular:  RRR, normal S1, S2,  Occasional premature contractions, without m/r/g.  2+ pulses, warm extremities  Respiratory:  CTA bilaterally without increased WOB.  Abdomen:  NABS.  Soft, ND/NT.    Skin:  No rashes or focal lesions.  Some bruising around the left knee and thigh.  Musculoskeletal:  Normal bulk and tone.  No LE edema.  Psychiatric:  A & O x 4.  Appropriate affect.  Neurologic:  CN 3-12 intact.  5/5 strength.  Sensation  intact.  Labs on Admission:  Basic Metabolic Panel:  Recent Labs Lab 11/10/13 1653  NA 135  K 3.4*  CL 96  CO2 26  GLUCOSE 141*  BUN 9  CREATININE 0.78  CALCIUM 9.8   Liver Function Tests:  Recent Labs Lab 11/10/13 1653  AST 22  ALT 28  ALKPHOS 58  BILITOT 0.5  PROT 7.9  ALBUMIN 4.2   No results found for this basename: LIPASE, AMYLASE,  in the last 168 hours No results found for this basename: AMMONIA,  in the last 168 hours CBC:  Recent Labs Lab 11/10/13 1653  WBC 7.6  NEUTROABS 5.4  HGB 13.9  HCT 40.8  MCV 85.7  PLT 389   Cardiac Enzymes: No results found for this basename: CKTOTAL, CKMB, CKMBINDEX, TROPONINI,  in the last 168 hours BNP: No components found with this basename: POCBNP,  CBG: No results found for this basename: GLUCAP,  in the last 168 hours  Radiological Exams on Admission:  No results found.  EKG:  pending  Time spent: 75 min  Tylique Aull [facility] Pager [phone]  If 7PM-7AM, please contact night-coverage [email or web address] Password [facility] 11/17/2013, 7:43 AM

## 2013-11-17 NOTE — [facility] Suicide Risk Assessment (Signed)
[facility] INPATIENT:  Family/Significant Other Suicide Prevention Education  Suicide Prevention Education:  Education Completed; No one has been identified by the patient as the family member/significant other with whom the patient will be residing, and identified as the person(s) who will aid the patient in the event of a mental health crisis (suicidal ideations/suicide attempt).  With written consent from the patient, the family member/significant other has been provided the following suicide prevention education, prior to the and/or following the discharge of the patient.  The suicide prevention education provided includes the following:  Suicide risk factors  Suicide prevention and interventions  National Suicide Hotline telephone number  [facility] assessment telephone number  [facility] Emergency Assistance 911  [facility] and/or Residential Mobile Crisis Unit telephone number  Request made of family/significant other to:  Remove weapons (e.g., guns, rifles, knives), all items previously/currently identified as safety concern.    Remove drugs/medications (over-the-counter, prescriptions, illicit drugs), all items previously/currently identified as a safety concern.  The family member/significant other verbalizes understanding of the suicide prevention education information provided.  The family member/significant other agrees to remove the items of safety concern listed above.  The patient did not endorse SI at the time of admission, nor did the patient c/o SI during the stay here.  SPE not required.   Daryel Gerald B 11/17/2013, 5:22 PM

## 2013-11-17 NOTE — Progress Notes (Signed)
Patient ID: Francisco Hill, male   DOB: 12/22/1960, 52 y.o.   MRN: [id number] [facility] MD Progress Note  11/17/2013 3:31 PM Andru Genter  MRN:  [id number] Subjective: Patient states "I am taking my medications. My long time Doctor from back home told me I need them right now. I was really revved up yesterday but feel calmer today. I really think this could all be an effect from the hip surgery I recently had. I'm not used to taking things like oxycodone."   Objective:  Patient is documented to have slept more last night. The patient is less religiously preoccupied today. He is showing some insight into his current condition. Patient has been complaint with his medications today denying any adverse effects. Patient is pleasant during interaction with Clinical research associate and no reported behavior problems have occurred today. Yesterday the patient was reported to be preaching to his peers about religion and was difficult to redirect.   DSM5: Axis I: Anxiety Disorder NOS, Mood Disorder NOS and Psychotic Disorder NOS Axis II: Deferred Axis III:  Past Medical History  Diagnosis Date  . Anxiety   . Depression   . Arrhythmia   . Hyperlipidemia    Axis IV: other psychosocial or environmental problems, problems related to social environment and problems with primary support group Axis V: 41-50 serious symptoms  ADL's:  Intact  Sleep: Poor  Appetite:  Poor  Suicidal Ideation:  Denies  Homicidal Ideation:  Denies   Psychiatric Specialty Exam: Review of Systems  Constitutional: Negative.   HENT: Negative.   Eyes: Negative.   Respiratory: Negative.   Cardiovascular: Negative.   Gastrointestinal: Negative.   Genitourinary: Negative.   Musculoskeletal: Negative.   Skin: Negative.   Neurological: Negative.   Endo/Heme/Allergies: Negative.   Psychiatric/Behavioral: Positive for hallucinations. Negative for depression, suicidal ideas, memory loss and substance abuse. The patient is nervous/anxious. The  patient does not have insomnia.     Blood pressure 106/72, pulse 68, temperature 97.7 F (36.5 C), temperature source Oral, resp. rate 16, height 5\' 8"  (1.727 m), weight 72.122 kg (159 lb).Body mass index is 24.18 kg/(m^2).  General Appearance: fairly groomed  Patent attorney::  good  Speech:  Clear and coherent  Volume:  normal  Mood: Anxious   Affect:  Congruent  Thought Process: Coherent and Linear  Orientation:  Full (Time, Place, and Person)  Thought Content:  Hallucinations: Auditory  Suicidal Thoughts:  No  Homicidal Thoughts:  No  Memory:  Immediate;   Fair Recent;   Fair Remote;   Fair  Judgement: marginal  Insight:  shallow  Psychomotor Activity:  Decreased  Concentration:  Fair  Recall:  Fair  Akathisia:  No  Handed:  Right  AIMS (if indicated):     Assets:  Leisure Time Resilience  Sleep:  Number of Hours: 6.5   Current Medications: Current Facility-Administered Medications  Medication Dose Route Frequency Provider Last Rate Last Dose  . alum & mag hydroxide-simeth (MAALOX/MYLANTA) 200-200-20 MG/5ML suspension 30 mL  30 mL Oral Q4H PRN Cleotis Nipper, MD      . aspirin 325 MG tablet           . aspirin EC tablet 325 mg  325 mg Oral BID Renae Fickle, MD   325 mg at 11/17/13 0800  . benztropine (COGENTIN) tablet 0.5 mg  0.5 mg Oral BID Nelly Rout, MD   0.5 mg at 11/17/13 0736  . feeding supplement (ENSURE COMPLETE) (ENSURE COMPLETE) liquid 237 mL  237 mL Oral BID  BM Jeoffrey Massed, RD   237 mL at 11/17/13 1450  . haloperidol (HALDOL) tablet 2 mg  2 mg Oral BID Nelly Rout, MD   2 mg at 11/17/13 0735  . hydrochlorothiazide (HYDRODIURIL) tablet 25 mg  25 mg Oral Daily Nanine Means, NP   25 mg at 11/17/13 0736  . hydrOXYzine (ATARAX/VISTARIL) tablet 25 mg  25 mg Oral TID Nanine Means, NP   25 mg at 11/17/13 1140  . ibuprofen (ADVIL,MOTRIN) tablet 600 mg  600 mg Oral Q6H PRN Mojeed Akintayo      . labetalol (NORMODYNE) tablet 200 mg  200 mg Oral TID Maxie Barb,  MD   200 mg at 11/17/13 1140  . LORazepam (ATIVAN) injection 2 mg  2 mg Intramuscular Once Kristeen Mans, NP      . magnesium hydroxide (MILK OF MAGNESIA) suspension 30 mL  30 mL Oral Daily PRN Cleotis Nipper, MD      . multivitamin with minerals tablet 1 tablet  1 tablet Oral Daily Jeoffrey Massed, RD   1 tablet at 11/17/13 0735  . potassium chloride (K-DUR,KLOR-CON) CR tablet 10 mEq  10 mEq Oral Daily Nanine Means, NP   10 mEq at 11/17/13 0735  . traZODone (DESYREL) tablet 100 mg  100 mg Oral QHS,MR X 1 Nelly Rout, MD   100 mg at 11/16/13 2152    Lab Results:  No results found for this or any previous visit (from the past 48 hour(s)).  Physical Findings: AIMS: Facial and Oral Movements Muscles of Facial Expression: None, normal Lips and Perioral Area: None, normal Jaw: None, normal Tongue: None, normal,Extremity Movements Upper (arms, wrists, hands, fingers): None, normal Lower (legs, knees, ankles, toes): None, normal, Trunk Movements Neck, shoulders, hips: None, normal, Overall Severity Severity of abnormal movements (highest score from questions above): None, normal Incapacitation due to abnormal movements: None, normal Patient's awareness of abnormal movements (rate only patient's report): No Awareness, Dental Status Current problems with teeth and/or dentures?: No Does patient usually wear dentures?: No  CIWA:  CIWA-Ar Total: 3 COWS:  COWS Total Score: 3  Treatment Plan Summary: Daily contact with patient to assess and evaluate symptoms and progress in treatment Medication management  Plan:  Review of chart, vital signs, medications, and notes. 1-Individual and group therapy 2-Medication management for depression and anxiety:  3-Coping skills for depression, anxiety 4-Continue crisis stabilization and management 5-Address health issues-Internal medicine consult obtained for labile blood pressure readings. Stabilized since being started on Labetalol.  6-Treatment plan in  progress to prevent relapse of psychosis 7-Continue Haldol 2 mg twice daily to help with mood stabilization and psychosis,  Cogentin 0.5 mg twice a day for EPS, Vistaril 25 mg TID for anxiety.  8- Encourage patient to take his medication as prescribed. Anticipate d/c tomorrow if patient remains medication compliant.   Medical Decision Making Problem Points:  Established problem, improving (1), Review of last therapy session (1) and Review of psycho-social stressors (1) Data Points:  Order Aims Assessment (2) Review and summation of old records (2) Review of new medications or change in dosage (2) Detailed history was obtained from wife to help formulate diagnosis and treatment plan I certify that inpatient services furnished can reasonably be expected to improve the patient's condition.   Fransisca Kaufmann, NP-C 11/17/2013, 3:31 PM

## 2013-11-17 NOTE — Tx Team (Signed)
  Interdisciplinary Treatment Plan Update   Date Reviewed:  11/17/2013  Time Reviewed:  11:26 AM  Progress in Treatment:   Attending groups: Yes Participating in groups: Yes Taking medication as prescribed: Yes  Tolerating medication: Yes Family/Significant other contact made: Yes  Patient understands diagnosis: Yes  Discussing patient identified problems/goals with staff: Yes Medical problems stabilized or resolved: Yes Denies suicidal/homicidal ideation: Yes Patient has not harmed self or others: Yes  For review of initial/current patient goals, please see plan of care.  Estimated Length of Stay:  Likely d/c tomorrow  Reason for Continuation of Hospitalization:    New Problems/Goals identified:  N/A  Discharge Plan or Barriers:   return home, follow up outpt  Additional Comments:  Attendees:  Signature: Thedore Mins, MD 11/17/2013 11:26 AM   Signature: Richelle Ito, LCSW 11/17/2013 11:26 AM  Signature: Fransisca Kaufmann, NP 11/17/2013 11:26 AM  Signature: Joslyn Devon, RN 11/17/2013 11:26 AM  Signature: Liborio Nixon, RN 11/17/2013 11:26 AM  Signature:  11/17/2013 11:26 AM  Signature:   11/17/2013 11:26 AM  Signature:    Signature:    Signature:    Signature:    Signature:    Signature:      Scribe for Treatment Team:   Richelle Ito, LCSW  11/17/2013 11:26 AM

## 2013-11-17 NOTE — Progress Notes (Signed)
Patient ID: Francisco Hill, male   DOB: Nov 06, 1961, 52 y.o.   MRN: [id number] D: patient mood has improved from yesterday.  He is compliant with his medications today.  Patient is not as religiously preoccupied.  He is pleasant and cooperative.  Patient is very complimentary of the staff and their assistance.  EKG was performed on patient this morning with NSR.  His blood pressure has stabilized.  He denies any SI/HI/AVH.  A: continue to monitor medication management and MD orders.  Safety checks completed every 15 minutes per protocol.  Encourage and support patient as needed.  R: patient is receptive staff and is interacting well with others.

## 2013-11-17 NOTE — Progress Notes (Signed)
Adult Psychoeducational Group Note  Date:  11/17/2013 Time:  8:59 PM  Group Topic/Focus:  Wrap-Up Group:   The focus of this group is to help patients review their daily goal of treatment and discuss progress on daily workbooks.  Participation Level:  Active  Participation Quality:  Appropriate and Supportive  Affect:  Appropriate  Cognitive:  Alert  Insight: Appropriate  Engagement in Group:  Engaged  Modes of Intervention:  Discussion  Additional Comments:  Patient engaged in group discussion. Patient pleasant during group. Patient shared new years resolutions with group. Patient stated he wants to reconnect with his family. Patient stated he enjoyed his day.  Elvera Bicker 11/17/2013, 8:59 PM

## 2013-11-17 NOTE — Progress Notes (Signed)
[facility] Adult Case Management Discharge Plan :  Will you be returning to the same living situation after discharge: Yes,  home At discharge, do you have transportation home?:Yes,  wife Do you have the ability to pay for your medications:Yes,  insurance  Release of information consent forms completed and in the chart;  Patient's signature needed at discharge.  Patient to Follow up at: Follow-up Information   Follow up with Dr Bynum Bellows On 11/22/2013.   Contact information:   [address] [facility]  [phone]      Follow up with [facility]. (I will call Marcelino Duster when they get back with me on Friday to give you an appointment time and date)       Patient denies SI/HI:   Yes,  yes    Safety Planning and Suicide Prevention discussed:  Yes,  yes  Ida Rogue 11/17/2013, 5:22 PM

## 2013-11-18 LAB — HEMOGLOBIN A1C
Hgb A1c MFr Bld: 4.6 % (ref <5.7)
Mean Plasma Glucose: 85 mg/dL (ref <117)

## 2013-11-18 MED ORDER — TRAZODONE HCL 100 MG PO TABS: 100.0000 mg | ORAL_TABLET | Freq: Every evening | ORAL | Status: AC | PRN

## 2013-11-18 MED ORDER — HYDROCHLOROTHIAZIDE 25 MG PO TABS: 25.0000 mg | ORAL_TABLET | Freq: Every day | ORAL | Status: AC

## 2013-11-18 MED ORDER — POTASSIUM CHLORIDE CRYS ER 10 MEQ PO TBCR: 10.0000 meq | EXTENDED_RELEASE_TABLET | Freq: Every day | ORAL | Status: AC

## 2013-11-18 MED ORDER — ADULT MULTIVITAMIN W/MINERALS CH: 1.0000 | ORAL_TABLET | Freq: Every day | ORAL | Status: AC

## 2013-11-18 MED ORDER — HYDROXYZINE HCL 25 MG PO TABS: 25.0000 mg | ORAL_TABLET | Freq: Three times a day (TID) | ORAL | Status: AC

## 2013-11-18 MED ORDER — BENZTROPINE MESYLATE 0.5 MG PO TABS: 0.5000 mg | ORAL_TABLET | Freq: Two times a day (BID) | ORAL | Status: AC

## 2013-11-18 MED ORDER — ASPIRIN 81 MG PO TABS: 324.0000 mg | ORAL_TABLET | Freq: Two times a day (BID) | ORAL | Status: AC

## 2013-11-18 MED ORDER — HALOPERIDOL 2 MG PO TABS: 2.0000 mg | ORAL_TABLET | Freq: Two times a day (BID) | ORAL | Status: AC

## 2013-11-18 NOTE — Progress Notes (Signed)
Hemoglobin A1c wnl.  BP trending to low normal so discontinued labetalol.  Continue HCTZ.  Will need BMP in 1 week to check electrolytes.    Follow up with primary care doctor for further management of blood pressure in 1-2 weeks.

## 2013-11-18 NOTE — Progress Notes (Cosign Needed)
Pt d/c to home with wife, Marcelino DusterMichelle. D/c instructions, rx's, and suicide prevention information reviewed and given. Pt verbalizes understanding. Medication samples given and reviewed; pt verbalizes understanding. Instructions also reviewed with pt's wife. Pt denies s.i.

## 2013-11-18 NOTE — [facility] Suicide Risk Assessment (Signed)
Suicide Risk Assessment  Discharge Assessment     Demographic Factors:  Male and Caucasian  Mental Status Per Nursing Assessment::   On Admission:  NA  Current Mental Status by Physician: Patient denies suicidal ideation, intent or plan  Loss Factors: Decrease in vocational status and Decline in physical health  Historical Factors: Impulsivity  Risk Reduction Factors:   Sense of responsibility to family, Living with another person, especially a relative and Positive social support  Continued Clinical Symptoms:  Resolving mood and delusional symptoms  Cognitive Features That Contribute To Risk:  Closed-mindedness    Suicide Risk:  Minimal: No identifiable suicidal ideation.  Patients presenting with no risk factors but with morbid ruminations; may be classified as minimal risk based on the severity of the depressive symptoms  Discharge Diagnoses:   AXIS I:  Adjustment disorder with mixed anxiety and depressed mood              Delusional disorder, Nos AXIS II:  Deferred AXIS III:   Past Medical History  Diagnosis Date  . Anxiety   . Depression   . Arrhythmia   . Hyperlipidemia    AXIS IV:  other psychosocial or environmental problems and problems related to social environment AXIS V:  61-70 mild symptoms  Plan Of Care/Follow-up recommendations:  Activity:  as tolerated Diet:  healthy Tests:  routine Other:  patient to keep his after care appointment  Is patient on multiple antipsychotic therapies at discharge:  No   Has Patient had three or more failed trials of antipsychotic monotherapy by history:  No  Recommended Plan for Multiple Antipsychotic Therapies: NA  Thedore MinsAkintayo, Saachi Zale, MD 11/18/2013, 10:15 AM

## 2013-11-18 NOTE — Discharge Summary (Signed)
Physician Discharge Summary Note  Patient:  Francisco Hill is an 53 y.o., male MRN:  [id number] DOB:  [birth year] Patient phone:  [phone] (home)  Patient address:   [address] [place] [address],   Date of Admission:  11/10/2013 Date of Discharge: 11/18/13  Reason for Admission:  Depression, Anxiety   Discharge Diagnoses: Principal Problem:   Adjustment disorder with mixed anxiety and depressed mood Active Problems:   Bipolar disorder, curr episode depressed, severe, w/psychotic features   Delusional disorder   Essential hypertension, benign   Other and unspecified hyperlipidemia   Elevated blood sugar  Review of Systems  Constitutional: Negative.   HENT: Negative.   Eyes: Negative.   Respiratory: Negative.   Cardiovascular: Negative.   Gastrointestinal: Negative.   Genitourinary: Negative.   Musculoskeletal: Negative.   Skin: Negative.   Neurological: Negative.   Endo/Heme/Allergies: Negative.   Psychiatric/Behavioral: Positive for depression. Negative for suicidal ideas, hallucinations, memory loss and substance abuse. The patient is not nervous/anxious and does not have insomnia.     DSM5: Axis Diagnosis:  AXIS I: Adjustment disorder with mixed anxiety and depressed mood  Delusional disorder, Nos  AXIS II: Deferred  AXIS III:  Past Medical History   Diagnosis  Date   .  Anxiety    .  Depression    .  Arrhythmia    .  Hyperlipidemia     AXIS IV: other psychosocial or environmental problems and problems related to social environment  AXIS V: 61-70 mild symptoms   Level of Care:  OP  Hospital Course:  Patient is a 53 year old man who was brought th the ED yesterday by his wife and his father-in-law. Patient denies prior history of mental illness or substance abuse. Patient and his spouse live in [place], [place], and are in [place] visiting the spouse's family for the holidays. Patient reports he has been under a lot of stress in the last few weeks. States  that he been full of prayer to the Lord to protect his family." Patient reports that he has been "overloaded with circumstances" recently. He reports that his adult daughter will soon be flying to [place] to visit her boyfriend who is currently deployed there with the [employer]. He is very fearful for her safety considering that Ebola Virus is still affecting people in that country. Patient also reports that his adult son has been diagnosed with schizophrenia, and he just found out that the son has been admitted to a psychiatric facility. He also reports that he is just recovering from Hip surgery he had about 2.5 weeks ago. Patient further reports some conflict with his wife of the past 10 years, stating that she is "pretty self absorbed, worries about herself." He expresses some remorse about divorcing his first wife, who is the mother of the two adult children. Patient also reports mild financial stress after having purchased a house as an investment that has become more burdensome than expected. He states that due to all these stressors he has been feeling anxious, worry a lot, having difficulty sleeping, difficulty concentrating and feeling depressed. He reports that he has been praying more to his lord but denies psychosis or delusions. He also denies suicidal ideation, intent or plan.         Francisco Hill was admitted to the adult unit. He was evaluated and his symptoms were identified. Medication management was discussed and initiated. The patient had not previously been prescribed any psychiatric medications. Patient was  started on Haldol 2 mg BID for psychosis and Vistaril 25 mg TID for anxiety.  He was oriented to the unit and encouraged to participate in unit programming. Medical problems were identified and treated appropriately. Home medication was restarted as needed.        The patient was evaluated each day by a clinical provider to ascertain the patient's response to treatment. Patient at  first was noted to be very religiously preoccupied on the unit. The patient had one episode on the unit where he became very agitated stating "I'm just trying to get the word of Jesus Christ out!" after another patient upset him. He refused medications as well but later became compliant after encouragement from family. Improvement was noted by the patient's report of decreasing symptoms, improved sleep and appetite, affect, medication tolerance, behavior, and participation in unit programming.  Francisco Hill was asked each day to complete a self inventory noting mood, mental status, pain, new symptoms, anxiety and concerns.         He responded well to medication and being in a therapeutic and supportive environment. Positive and appropriate behavior was noted and the patient was motivated for recovery.  Francisco Hill worked closely with the treatment team and case manager to develop a discharge plan with appropriate goals. Coping skills, problem solving as well as relaxation therapies were also part of the unit programming.         By the day of discharge Francisco Hill was in much improved condition than upon admission.  Symptoms were reported as significantly decreased or resolved completely.  The patient denied SI/HI and voiced no AVH. He was motivated to continue taking medication with a goal of continued improvement in mental health.          Francisco Hill was discharged home with a plan to follow up as noted below.  Consults:  Internal medicine for elevated blood pressure   Significant Diagnostic Studies:  labs: Admission labs completed and reviewed   Discharge Vitals:   Blood pressure 105/68, pulse 80, temperature 96.9 F (36.1 C), temperature source Oral, resp. rate 18, height 5\' 8"  (1.727 m), weight 72.122 kg (159 lb). Body mass index is 24.18 kg/(m^2). Lab Results:   No results found for this or any previous visit (from the past 72 hour(s)).  Physical Findings: AIMS: Facial and Oral  Movements Muscles of Facial Expression: None, normal Lips and Perioral Area: None, normal Jaw: None, normal Tongue: None, normal,Extremity Movements Upper (arms, wrists, hands, fingers): None, normal Lower (legs, knees, ankles, toes): None, normal, Trunk Movements Neck, shoulders, hips: None, normal, Overall Severity Severity of abnormal movements (highest score from questions above): None, normal Incapacitation due to abnormal movements: None, normal Patient's awareness of abnormal movements (rate only patient's report): No Awareness, Dental Status Current problems with teeth and/or dentures?: No Does patient usually wear dentures?: No  CIWA:  CIWA-Ar Total: 3 COWS:  COWS Total Score: 3  Psychiatric Specialty Exam: See Psychiatric Specialty Exam and Suicide Risk Assessment completed by Attending Physician prior to discharge.  Discharge destination:  Home  Is patient on multiple antipsychotic therapies at discharge:  No   Has Patient had three or more failed trials of antipsychotic monotherapy by history:  No  Recommended Plan for Multiple Antipsychotic Therapies: NA  Discharge Orders   Future Orders Complete By Expires   Discharge instructions  As directed    Comments:     Please see your Primary Care MD for further follow up of your  elevated blood pressure. You were started on medicine during your stay to manage this condition. A prescription for one month was provided.       Medication List    STOP taking these medications       FISH OIL PO     oxycodone 5 MG capsule  Commonly known as:  OXY-IR      TAKE these medications     Indication   aspirin 81 MG tablet  Take 4 tablets (324 mg total) by mouth 2 (two) times daily.   Indication:  Mild to Moderate Pain     benztropine 0.5 MG tablet  Commonly known as:  COGENTIN  Take 1 tablet (0.5 mg total) by mouth 2 (two) times daily.   Indication:  Extrapyramidal Reaction caused by Medications     haloperidol 2 MG  tablet  Commonly known as:  HALDOL  Take 1 tablet (2 mg total) by mouth 2 (two) times daily.   Indication:  Psychosis     hydrochlorothiazide 25 MG tablet  Commonly known as:  HYDRODIURIL  Take 1 tablet (25 mg total) by mouth daily.   Indication:  hypertension     hydrOXYzine 25 MG tablet  Commonly known as:  ATARAX/VISTARIL  Take 1 tablet (25 mg total) by mouth 3 (three) times daily.   Indication:  Anxiety Neurosis     multivitamin with minerals Tabs tablet  Take 1 tablet by mouth daily.   Indication:  Vitamin Supplementation     potassium chloride 10 MEQ tablet  Commonly known as:  K-DUR,KLOR-CON  Take 1 tablet (10 mEq total) by mouth daily.   Indication:  High Blood Pressure, Low Amount of Potassium in the Blood     traZODone 100 MG tablet  Commonly known as:  DESYREL  Take 1 tablet (100 mg total) by mouth at bedtime and may repeat dose one time if needed.   Indication:  Trouble Sleeping           Follow-up Information   Follow up with Dr Bynum Bellows On 11/22/2013.   Contact information:   [address] [facility]  [phone]      Follow up with [facility]. (I will call Marcelino Duster when they get back with me on Friday to give you an appointment time and date)       Follow-up recommendations: Activity: as tolerated  Diet: healthy  Tests: routine  Other: patient to keep his after care appointment   Comments:  Take all your medications as prescribed by your mental healthcare provider.  Report any adverse effects and or reactions from your medicines to your outpatient provider promptly.  Patient is instructed and cautioned to not engage in alcohol and or illegal drug use while on prescription medicines.  In the event of worsening symptoms, patient is instructed to call the crisis hotline, 911 and or go to the nearest ED for appropriate evaluation and treatment of symptoms.  Follow-up with your primary care provider for your other  medical issues, concerns and or health care needs.   Total Discharge Time:  Greater than 30 minutes.  SignedFransisca Kaufmann NP-C 11/18/2013, 9:45 AM

## 2013-11-18 NOTE — Progress Notes (Signed)
Seen and agreed. Folashade Gamboa, MD 

## 2013-11-19 NOTE — Discharge Summary (Signed)
Seen and agreed. Jezebelle Ledwell, MD 

## 2013-11-23 NOTE — Progress Notes (Signed)
Patient Discharge Instructions:  After Visit Summary (AVS):   Faxed to:  11/23/13 Discharge Summary Note:   Faxed to:  11/23/13 Psychiatric Admission Assessment Note:   Faxed to:  11/23/13 Suicide Risk Assessment - Discharge Assessment:   Faxed to:  11/23/13 Faxed/Sent to the Next Level Care provider:  11/23/13 Faxed to Dr. Bynum BellowsEugene Bailey @ [phone]  Jerelene ReddenSheena E Allen [place], 11/23/2013, 4:18 PM

## 2015-02-10 IMAGING — CT CT HEAD W/O CM
1 of 2 series · 16 of 30 positions shown, 20 images · non-contrast
Comparison: None

CLINICAL DATA: Altered mental status

EXAM:
CT HEAD WITHOUT CONTRAST
TECHNIQUE: Contiguous axial images were obtained from the base of the skull
through the vertex without contrast.

[Series 2: headseq 4.8 h37s · axial · 0.45mm/px · z∈[+1128,+1288]mm · 16 of 36 slices shown, 20 images]
[im 2/36  brain]
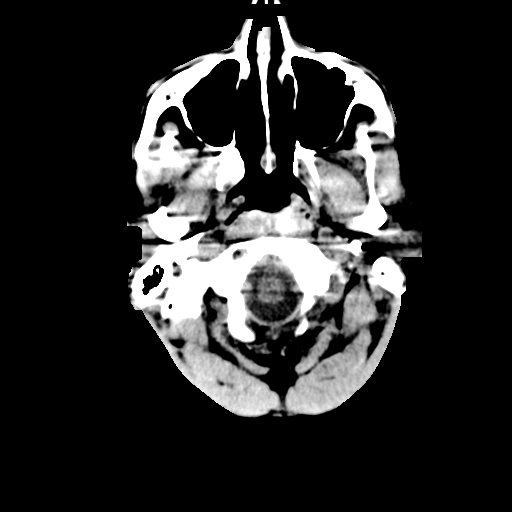
[im 2/36  bone]
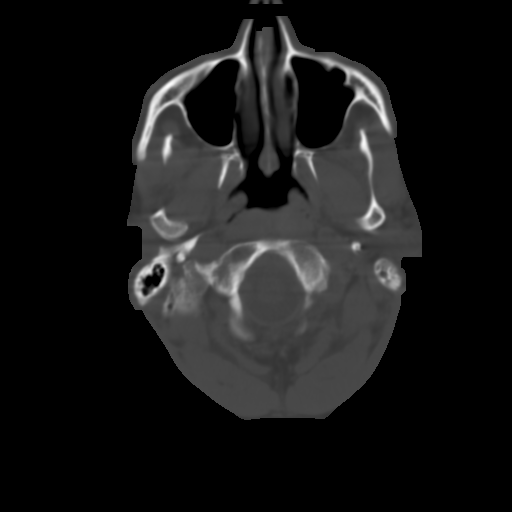
[im 4/36  brain]
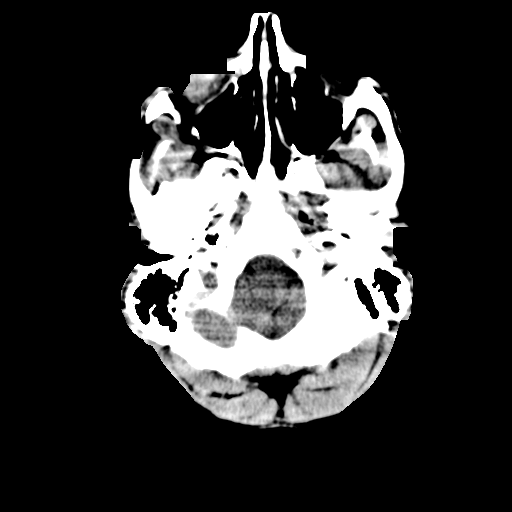
[im 7/36  brain]
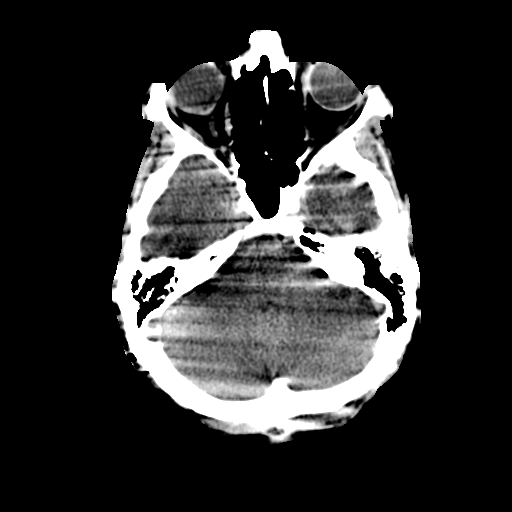
[im 9/36  brain]
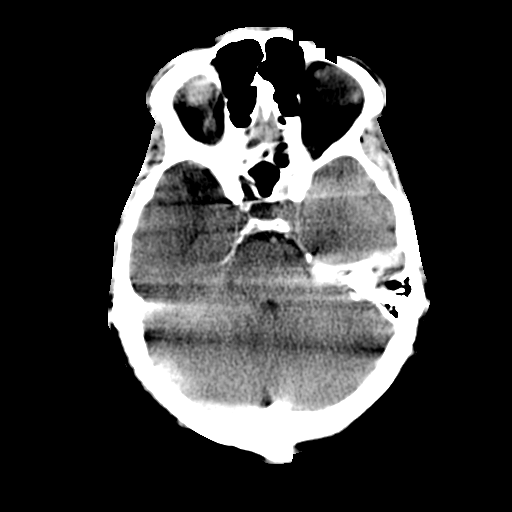
[im 11/36  brain]
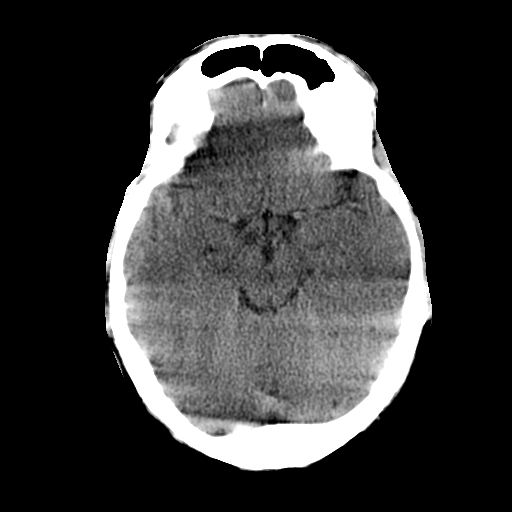
[im 11/36  bone]
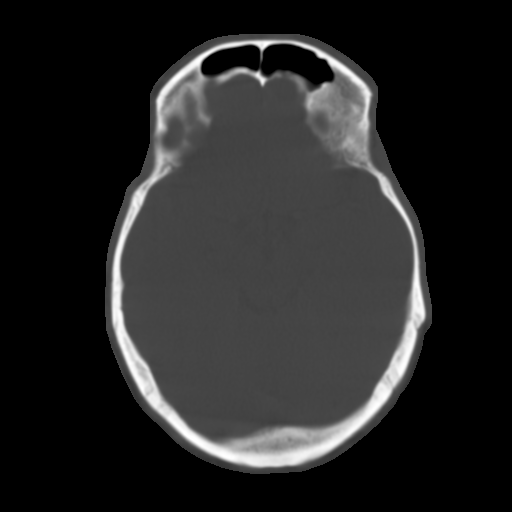
[im 12/36  brain]
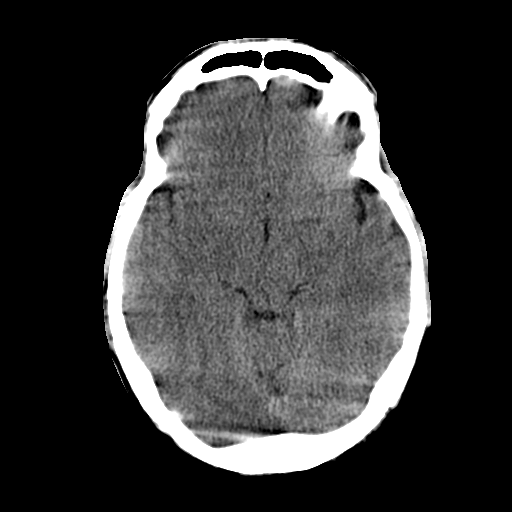
[im 16/36  brain]
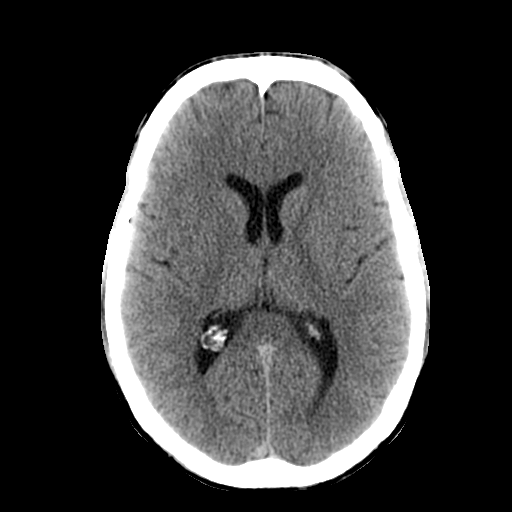
[im 17/36  brain]
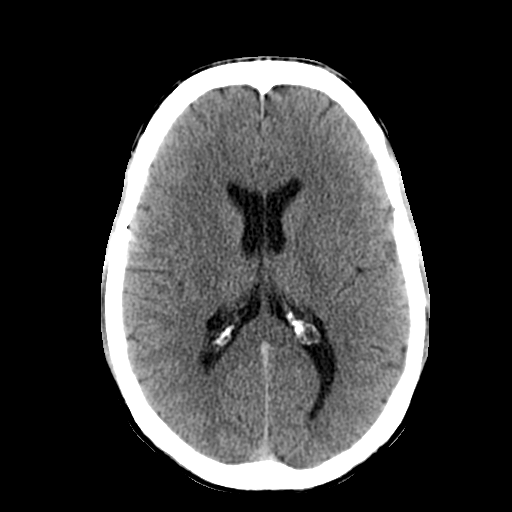
[im 19/36  brain]
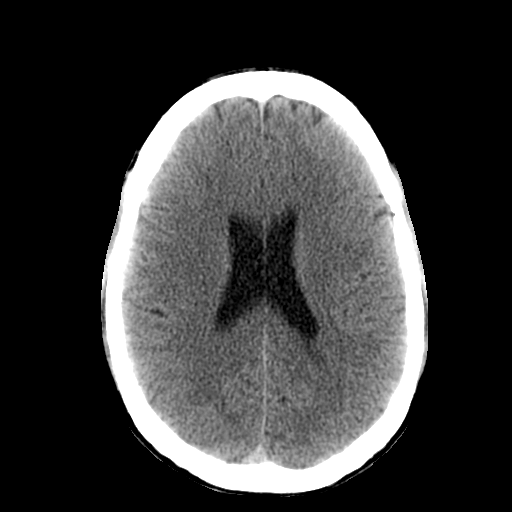
[im 19/36  bone]
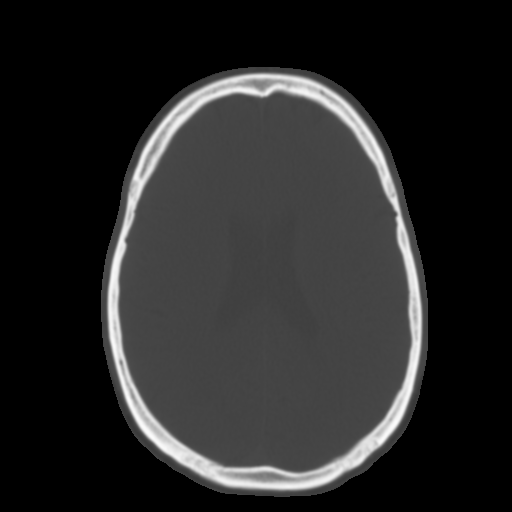
[im 21/36  brain]
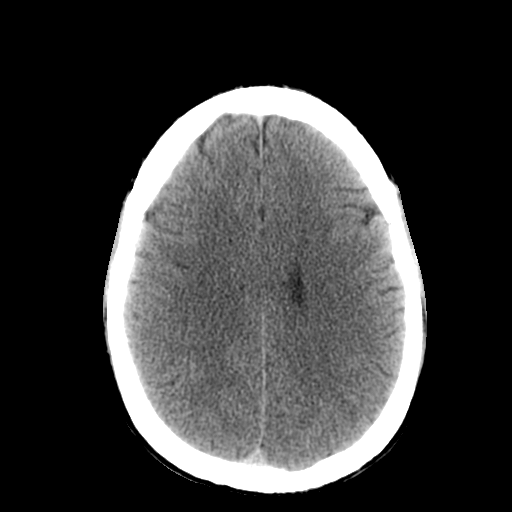
[im 24/36  brain]
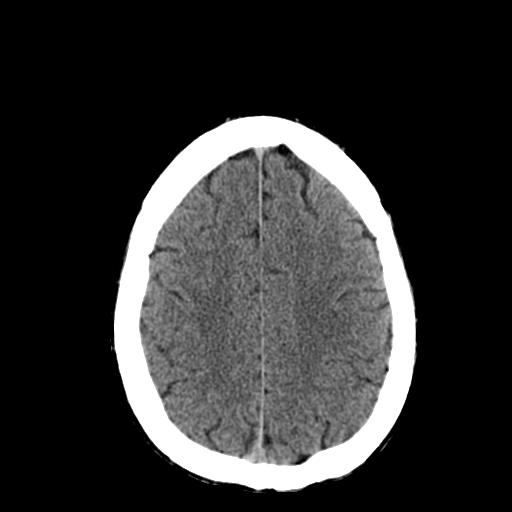
[im 26/36  brain]
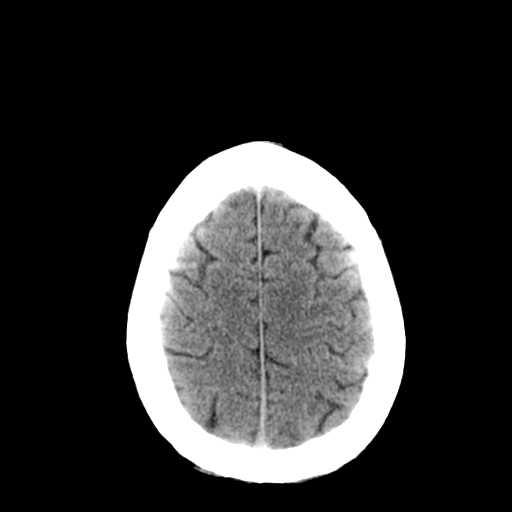
[im 27/36  brain]
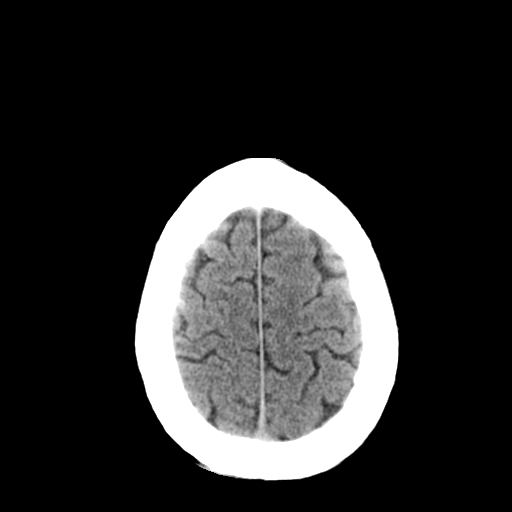
[im 27/36  bone]
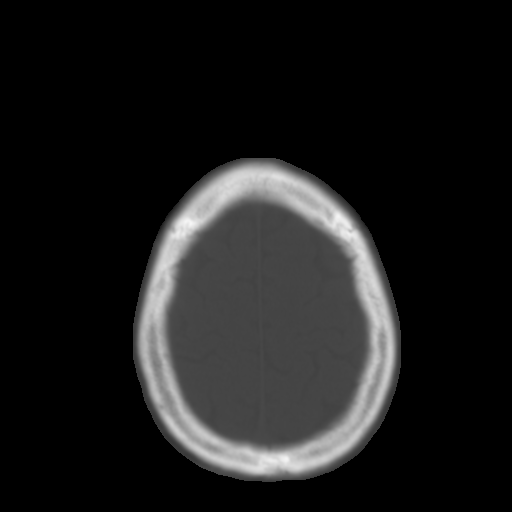
[im 29/36  brain]
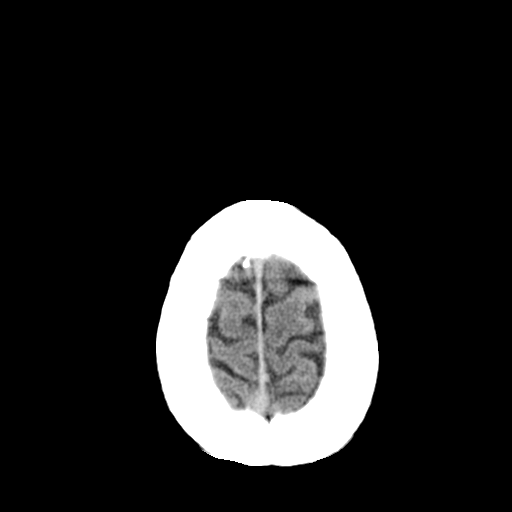
[im 32/36  brain]
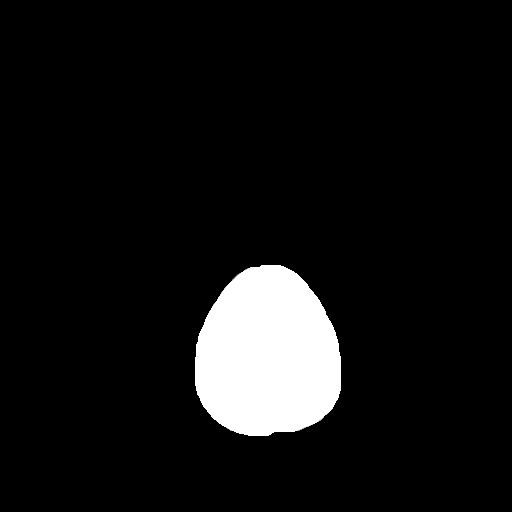
[im 34/36  brain]
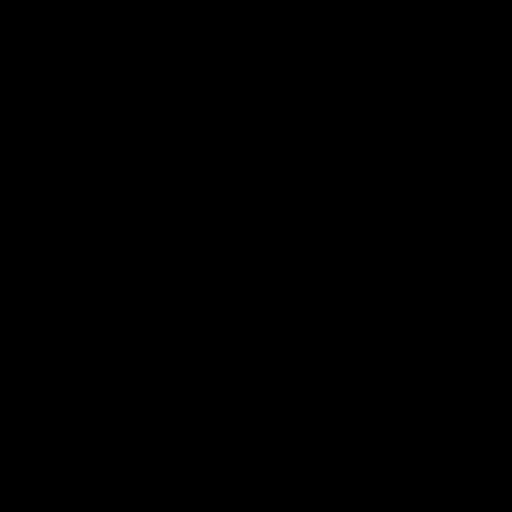

[16 of 30 positions shown; findings below may reference images not displayed]

FINDINGS: Normal appearance of the intracranial structures. No evidence for
acute hemorrhage, mass lesion, midline shift, hydrocephalus or large
infarct. No acute bony abnormality. The visualized sinuses are
clear.
IMPRESSION: No acute intracranial abnormality.
